# Patient Record
Sex: Female | Born: 1990 | Race: White | Hispanic: No | Marital: Married | Smoking: Never smoker
Health system: Southern US, Community
[De-identification: ages and names within clinical notes are randomized; demographics above are authoritative.]

## PROBLEM LIST (undated history)

## (undated) DIAGNOSIS — F32A Depression, unspecified: Secondary | ICD-10-CM

## (undated) DIAGNOSIS — F419 Anxiety disorder, unspecified: Secondary | ICD-10-CM

## (undated) DIAGNOSIS — Z789 Other specified health status: Secondary | ICD-10-CM

## (undated) DIAGNOSIS — F909 Attention-deficit hyperactivity disorder, unspecified type: Secondary | ICD-10-CM

## (undated) DIAGNOSIS — F329 Major depressive disorder, single episode, unspecified: Secondary | ICD-10-CM

## (undated) HISTORY — DX: Attention-deficit hyperactivity disorder, unspecified type: F90.9

## (undated) HISTORY — DX: Other specified health status: Z78.9

## (undated) HISTORY — PX: NO PAST SURGERIES: SHX2092

---
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
## 2010-04-25 ENCOUNTER — Emergency Department: Payer: Self-pay | Admitting: Emergency Medicine

## 2014-02-07 DIAGNOSIS — M546 Pain in thoracic spine: Secondary | ICD-10-CM | POA: Insufficient documentation

## 2014-04-25 NOTE — L&D Delivery Note (Signed)
Delivery Note At 6:32 PM a viable female was delivered via Vaginal, Spontaneous Delivery (Presentation: OA ;  ).  APGAR:9/9   , ; weight  .   Placenta status:intact>>spont , .  Cord:  with the following complications: .  Cord pH: not sent  Anesthesia: Epidural  Episiotomy:  nono Lacerations:  Sec deg Suture Repair: 3.0 vicryl rapide Est. Blood Loss (mL):  300 Mom to postpartum.  Baby to Couplet care / Skin to Skin.  [name],Yasmeen Manka M 03/02/2015, 6:47 PM

## 2014-07-22 LAB — OB RESULTS CONSOLE RUBELLA ANTIBODY, IGM: RUBELLA: NON-IMMUNE/NOT IMMUNE

## 2014-07-22 LAB — OB RESULTS CONSOLE ABO/RH: RH Type: POSITIVE

## 2014-07-22 LAB — OB RESULTS CONSOLE RPR: RPR: NONREACTIVE

## 2014-07-22 LAB — OB RESULTS CONSOLE GC/CHLAMYDIA
CHLAMYDIA, DNA PROBE: NEGATIVE
Gonorrhea: NEGATIVE

## 2014-07-22 LAB — OB RESULTS CONSOLE HIV ANTIBODY (ROUTINE TESTING): HIV: NONREACTIVE

## 2014-07-22 LAB — OB RESULTS CONSOLE HEPATITIS B SURFACE ANTIGEN: HEP B S AG: NEGATIVE

## 2014-07-22 LAB — OB RESULTS CONSOLE ANTIBODY SCREEN: Antibody Screen: NEGATIVE

## 2014-09-21 ENCOUNTER — Encounter (HOSPITAL_COMMUNITY): Payer: Self-pay | Admitting: Emergency Medicine

## 2014-09-21 ENCOUNTER — Emergency Department (HOSPITAL_COMMUNITY)
Admission: EM | Admit: 2014-09-21 | Discharge: 2014-09-21 | Disposition: A | Discharge status: Home / Self Care | Payer: BLUE CROSS/BLUE SHIELD | Attending: Emergency Medicine | Admitting: Emergency Medicine

## 2014-09-21 DIAGNOSIS — R000 Tachycardia, unspecified: Secondary | ICD-10-CM | POA: Insufficient documentation

## 2014-09-21 DIAGNOSIS — O9989 Other specified diseases and conditions complicating pregnancy, childbirth and the puerperium: Secondary | ICD-10-CM | POA: Insufficient documentation

## 2014-09-21 DIAGNOSIS — B349 Viral infection, unspecified: Secondary | ICD-10-CM | POA: Insufficient documentation

## 2014-09-21 DIAGNOSIS — Z3A17 17 weeks gestation of pregnancy: Secondary | ICD-10-CM | POA: Insufficient documentation

## 2014-09-21 DIAGNOSIS — O98812 Other maternal infectious and parasitic diseases complicating pregnancy, second trimester: Secondary | ICD-10-CM | POA: Diagnosis present

## 2014-09-21 DIAGNOSIS — R05 Cough: Secondary | ICD-10-CM

## 2014-09-21 DIAGNOSIS — R059 Cough, unspecified: Secondary | ICD-10-CM

## 2014-09-21 NOTE — Discharge Instructions (Signed)
Return to the emergency room with worsening of symptoms, new symptoms or with symptoms that are concerning, especially chest pain, shortness of breath, difficulty breathing, coughing up blood, leg swelling, abdominal pain, vaginal discharge or bleeding., Fevers or chills. Discussed continued use of Sudafed with your OB/GYN before taking additional doses. You may take Benadryl at night to help with sleep for the next 3-4 days. Please call your doctor for a followup appointment within 24-48 hours. When you talk to your doctor please let them know that you were seen in the emergency department and have them acquire all of your records so that they can discuss the findings with you and formulate a treatment plan to fully care for your new and ongoing problems. Read below information and follow recommendations. Upper Respiratory Infection, Adult An upper respiratory infection (URI) is also sometimes known as the common cold. The upper respiratory tract includes the nose, sinuses, throat, trachea, and bronchi. Bronchi are the airways leading to the lungs. Most people improve within 1 week, but symptoms can last up to 2 weeks. A residual cough may last even longer.  CAUSES Many different viruses can infect the tissues lining the upper respiratory tract. The tissues become irritated and inflamed and often become very moist. Mucus production is also common. A cold is contagious. You can easily spread the virus to others by oral contact. This includes kissing, sharing a glass, coughing, or sneezing. Touching your mouth or nose and then touching a surface, which is then touched by another person, can also spread the virus. SYMPTOMS  Symptoms typically develop 1 to 3 days after you come in contact with a cold virus. Symptoms vary from person to person. They may include:  Runny nose.  Sneezing.  Nasal congestion.  Sinus irritation.  Sore throat.  Loss of voice (laryngitis).  Cough.  Fatigue.  Muscle  aches.  Loss of appetite.  Headache.  Low-grade fever. DIAGNOSIS  You might diagnose your own cold based on familiar symptoms, since most people get a cold 2 to 3 times a year. Your caregiver can confirm this based on your exam. Most importantly, your caregiver can check that your symptoms are not due to another disease such as strep throat, sinusitis, pneumonia, asthma, or epiglottitis. Blood tests, throat tests, and X-rays are not necessary to diagnose a common cold, but they may sometimes be helpful in excluding other more serious diseases. Your caregiver will decide if any further tests are required. RISKS AND COMPLICATIONS  You may be at risk for a more severe case of the common cold if you smoke cigarettes, have chronic heart disease (such as heart failure) or lung disease (such as asthma), or if you have a weakened immune system. The very young and very old are also at risk for more serious infections. Bacterial sinusitis, middle ear infections, and bacterial pneumonia can complicate the common cold. The common cold can worsen asthma and chronic obstructive pulmonary disease (COPD). Sometimes, these complications can require emergency medical care and may be life-threatening. PREVENTION  The best way to protect against getting a cold is to practice good hygiene. Avoid oral or hand contact with people with cold symptoms. Wash your hands often if contact occurs. There is no clear evidence that vitamin C, vitamin E, echinacea, or exercise reduces the chance of developing a cold. However, it is always recommended to get plenty of rest and practice good nutrition. TREATMENT  Treatment is directed at relieving symptoms. There is no cure. Antibiotics are not effective,  because the infection is caused by a virus, not by bacteria. Treatment may include:  Increased fluid intake. Sports drinks offer valuable electrolytes, sugars, and fluids.  Breathing heated mist or steam (vaporizer or  shower).  Eating chicken soup or other clear broths, and maintaining good nutrition.  Getting plenty of rest.  Using gargles or lozenges for comfort.  Controlling fevers with ibuprofen or acetaminophen as directed by your caregiver.  Increasing usage of your inhaler if you have asthma. Zinc gel and zinc lozenges, taken in the first 24 hours of the common cold, can shorten the duration and lessen the severity of symptoms. Pain medicines may help with fever, muscle aches, and throat pain. A variety of non-prescription medicines are available to treat congestion and runny nose. Your caregiver can make recommendations and may suggest nasal or lung inhalers for other symptoms.  HOME CARE INSTRUCTIONS   Only take over-the-counter or prescription medicines for pain, discomfort, or fever as directed by your caregiver.  Use a warm mist humidifier or inhale steam from a shower to increase air moisture. This may keep secretions moist and make it easier to breathe.  Drink enough water and fluids to keep your urine clear or pale yellow.  Rest as needed.  Return to work when your temperature has returned to normal or as your caregiver advises. You may need to stay home longer to avoid infecting others. You can also use a face mask and careful hand washing to prevent spread of the virus. SEEK MEDICAL CARE IF:   After the first few days, you feel you are getting worse rather than better.  You need your caregiver's advice about medicines to control symptoms.  You develop chills, worsening shortness of breath, or brown or red sputum. These may be signs of pneumonia.  You develop yellow or brown nasal discharge or pain in the face, especially when you bend forward. These may be signs of sinusitis.  You develop a fever, swollen neck glands, pain with swallowing, or white areas in the back of your throat. These may be signs of strep throat. SEEK IMMEDIATE MEDICAL CARE IF:   You have a fever.  You  develop severe or persistent headache, ear pain, sinus pain, or chest pain.  You develop wheezing, a prolonged cough, cough up blood, or have a change in your usual mucus (if you have chronic lung disease).  You develop sore muscles or a stiff neck. Document Released: 10/05/2000 Document Revised: 07/04/2011 Document Reviewed: 07/17/2013 [facility] Patient Information 2015 [place], [place]. This information is not intended to replace advice given to you by your health care provider. Make sure you discuss any questions you have with your health care provider.

## 2014-09-21 NOTE — ED Notes (Signed)
Pt arrives from home stating she has "the flu." C/o cough, sinus congestion. Dry cough.

## 2014-09-21 NOTE — ED Provider Notes (Signed)
CSN: [id number]     Arrival date & time 09/21/14  0546 History   First MD Initiated Contact with Patient 09/21/14 [id number]     Chief Complaint  Patient presents with  . Cough     (Consider location/radiation/quality/duration/timing/severity/associated sxs/prior Treatment) HPI  Maria Yoder is a 24 y.o. female who is [redacted] weeks pregnant with prenatal care presenting with 5 days of dry cough, sinus congestion. Symptoms worse at night and not improved with sudafed, robatussin recommended by pt's OBGYN. No chest pain or shortness of breath. Pt denies fevers chills, hemoptysis. Pt denies history of DVT, PE, recent surgery or trauma, malignancy, hemoptysis, unilateral leg swelling or tenderness, immobilization. Patient denies abdominal pain, vaginal bleeding, vaginal discharge. No urinary symptoms.    History reviewed. No pertinent past medical history. History reviewed. No pertinent past surgical history. No family history on file. History  Substance Use Topics  . Smoking status: Never Smoker   . Smokeless tobacco: Not on file  . Alcohol Use: No   OB History    Gravida Para Term Preterm AB TAB SAB Ectopic Multiple Living   1              Review of Systems 10 Systems reviewed and are negative for acute change except as noted in the HPI.    Allergies  Review of patient's allergies indicates no known allergies.  Home Medications   Prior to Admission medications   Medication Sig Start Date End Date Taking? Authorizing Provider  Doxylamine-Pyridoxine 10-10 MG TBEC Take 1 tablet by mouth 4 (four) times daily as needed (nauses).   Yes Historical Provider, MD  ondansetron (ZOFRAN) 4 MG tablet Take 4 mg by mouth every 8 (eight) hours as needed for nausea or vomiting.   Yes Historical Provider, MD  Prenatal Vit-Fe Fumarate-FA (PRENATAL MULTIVITAMIN) TABS tablet Take 1 tablet by mouth daily at 12 noon.   Yes Historical Provider, MD   BP 126/80 mmHg  Pulse 115  Temp(Src) 98 F (36.7 C)  (Oral)  Resp 22  Ht 5\' 10"  (1.778 m)  Wt 165 lb (74.844 kg)  BMI 23.68 kg/m2  SpO2 100%  LMP 04/25/2014 (Within Weeks) Physical Exam  Constitutional: She appears well-developed and well-nourished. No distress.  HENT:  Head: Normocephalic and atraumatic.  Nose: Rhinorrhea present.  Mouth/Throat: Posterior oropharyngeal edema and posterior oropharyngeal erythema present. No oropharyngeal exudate.  Eyes: Conjunctivae and EOM are normal. Right eye exhibits no discharge. Left eye exhibits no discharge.  Cardiovascular: Normal rate and regular rhythm.   No leg swelling or tenderness. Negative Homan's sign.  Pulmonary/Chest: Effort normal and breath sounds normal.  Normal effort with no respiratory distress.  Abdominal: Soft. Bowel sounds are normal. She exhibits no distension. There is no tenderness.  Lymphadenopathy:    She has cervical adenopathy.  Neurological: She is alert. She exhibits normal muscle tone. Coordination normal.  Skin: Skin is warm and dry. She is not diaphoretic.  Nursing note and vitals reviewed.   ED Course  Procedures (including critical care time) Labs Review Labs Reviewed - No data to display  Imaging Review No results found.   EKG Interpretation None      MDM   Final diagnoses:  Cough  Viral syndrome   Pt [redacted] weeks pregnant presenting with cough. She denies chest pain or shortness of breath. No exertional symptoms. VSS other than mild tachycardia likely related to pregnancy. No respiratory distress. No hypoxia Pt rhinorrhea, oropharynx erythema, edema and cervical lymphadenopathy. Pt with viral syndrome.  Pt low risk for PE other than her pregnancy. Pt history and exam not consistent with PE at this time. Pt with 101 HR on monitor during my assessment. Pt given strict return precautions. Pt to discuss further sudafed use with her OBGYN. Pt to take benadryl for congestion.   Discussed return precautions with patient. Discussed all results and patient  verbalizes understanding and agrees with plan.  Case has been discussed with Dr. Ranae Palms who agrees with the above plan and to discharge.   Filed Vitals:   09/21/14 0553 09/21/14 0607 09/21/14 0630  BP: 123/69 156/70 126/80  Pulse: 103 78 115  Temp: 97.5 F (36.4 C) 98 F (36.7 C)   TempSrc: Oral Oral   Resp: 16 22   Height:  (1.549 m)  (1.778 m)   Weight: 130 lb (58.968 kg) 165 lb (74.844 kg)   SpO2: 100% 99% 100%     Oswaldo Conroy, PA-C 09/21/14 0815  Loren Racer, MD 09/22/14 [phone]

## 2015-02-25 ENCOUNTER — Encounter (HOSPITAL_COMMUNITY): Payer: Self-pay | Admitting: *Deleted

## 2015-02-25 ENCOUNTER — Telehealth (HOSPITAL_COMMUNITY): Payer: Self-pay | Admitting: *Deleted

## 2015-02-25 LAB — OB RESULTS CONSOLE GBS: GBS: NEGATIVE

## 2015-02-25 NOTE — Telephone Encounter (Signed)
Preadmission screen  

## 2015-03-01 ENCOUNTER — Encounter (HOSPITAL_COMMUNITY): Payer: Self-pay | Admitting: *Deleted

## 2015-03-01 ENCOUNTER — Inpatient Hospital Stay (HOSPITAL_COMMUNITY)
Admission: AD | Admit: 2015-03-01 | Discharge: 2015-03-04 | DRG: 775 | Disposition: A | Discharge status: Home / Self Care | Payer: BLUE CROSS/BLUE SHIELD | Source: Ambulatory Visit | Attending: Obstetrics and Gynecology | Admitting: Obstetrics and Gynecology

## 2015-03-01 DIAGNOSIS — O26893 Other specified pregnancy related conditions, third trimester: Secondary | ICD-10-CM | POA: Diagnosis present

## 2015-03-01 DIAGNOSIS — Z3490 Encounter for supervision of normal pregnancy, unspecified, unspecified trimester: Secondary | ICD-10-CM

## 2015-03-01 DIAGNOSIS — Z3A40 40 weeks gestation of pregnancy: Secondary | ICD-10-CM | POA: Diagnosis not present

## 2015-03-01 LAB — CBC
HCT: 37.5 % (ref 36.0–46.0)
HEMOGLOBIN: 13 g/dL (ref 12.0–15.0)
MCH: 31.1 pg (ref 26.0–34.0)
MCHC: 34.7 g/dL (ref 30.0–36.0)
MCV: 89.7 fL (ref 78.0–100.0)
PLATELETS: 210 10*3/uL (ref 150–400)
RBC: 4.18 MIL/uL (ref 3.87–5.11)
RDW: 14 % (ref 11.5–15.5)
WBC: 10.2 10*3/uL (ref 4.0–10.5)

## 2015-03-01 LAB — TYPE AND SCREEN
ABO/RH(D): O POS
Antibody Screen: NEGATIVE

## 2015-03-01 MED ORDER — CITRIC ACID-SODIUM CITRATE 334-500 MG/5ML PO SOLN: 30.0000 mL | ORAL | Status: DC | PRN

## 2015-03-01 MED ORDER — ACETAMINOPHEN 325 MG PO TABS: 650.0000 mg | ORAL_TABLET | ORAL | Status: DC | PRN

## 2015-03-01 MED ORDER — ONDANSETRON HCL 4 MG/2ML IJ SOLN
4.0000 mg | Freq: Four times a day (QID) | INTRAMUSCULAR | Status: DC | PRN
  Administered 2015-03-02: 4 mg via INTRAVENOUS
  Filled 2015-03-01: qty 2

## 2015-03-01 MED ORDER — LIDOCAINE HCL (PF) 1 % IJ SOLN
30.0000 mL | INTRAMUSCULAR | Status: DC | PRN
  Administered 2015-03-02: 30 mL via SUBCUTANEOUS
  Filled 2015-03-01: qty 30

## 2015-03-01 MED ORDER — MISOPROSTOL 25 MCG QUARTER TABLET
25.0000 ug | ORAL_TABLET | ORAL | Status: DC | PRN
  Administered 2015-03-01 – 2015-03-02 (×2): 25 ug via VAGINAL
  Filled 2015-03-01 (×2): qty 0.25

## 2015-03-01 MED ORDER — ZOLPIDEM TARTRATE 5 MG PO TABS: 5.0000 mg | ORAL_TABLET | Freq: Every day | ORAL | Status: DC

## 2015-03-01 MED ORDER — TERBUTALINE SULFATE 1 MG/ML IJ SOLN: 0.2500 mg | Freq: Once | INTRAMUSCULAR | Status: DC | PRN

## 2015-03-01 MED ORDER — FLEET ENEMA 7-19 GM/118ML RE ENEM: 1.0000 | ENEMA | RECTAL | Status: DC | PRN

## 2015-03-01 MED ORDER — OXYCODONE-ACETAMINOPHEN 5-325 MG PO TABS: 2.0000 | ORAL_TABLET | ORAL | Status: DC | PRN

## 2015-03-01 MED ORDER — OXYCODONE-ACETAMINOPHEN 5-325 MG PO TABS: 1.0000 | ORAL_TABLET | ORAL | Status: DC | PRN

## 2015-03-02 ENCOUNTER — Inpatient Hospital Stay (HOSPITAL_COMMUNITY): Payer: BLUE CROSS/BLUE SHIELD | Admitting: Anesthesiology

## 2015-03-02 ENCOUNTER — Inpatient Hospital Stay (HOSPITAL_COMMUNITY): Admission: RE | Admit: 2015-03-02 | Payer: BLUE CROSS/BLUE SHIELD | Source: Ambulatory Visit

## 2015-03-02 ENCOUNTER — Encounter (HOSPITAL_COMMUNITY): Payer: Self-pay | Admitting: *Deleted

## 2015-03-02 LAB — ABO/RH: ABO/RH(D): O POS

## 2015-03-02 LAB — RPR: RPR: NONREACTIVE

## 2015-03-02 MED ORDER — PRENATAL MULTIVITAMIN CH
1.0000 | ORAL_TABLET | Freq: Every day | ORAL | Status: DC
  Administered 2015-03-03: 1 via ORAL
  Filled 2015-03-02 (×2): qty 1

## 2015-03-02 MED ORDER — TETANUS-DIPHTH-ACELL PERTUSSIS 5-2.5-18.5 LF-MCG/0.5 IM SUSP: 0.5000 mL | Freq: Once | INTRAMUSCULAR | Status: DC

## 2015-03-02 MED ORDER — WITCH HAZEL-GLYCERIN EX PADS
1.0000 "application " | MEDICATED_PAD | CUTANEOUS | Status: DC | PRN
  Administered 2015-03-03: 1 via TOPICAL

## 2015-03-02 MED ORDER — OXYCODONE-ACETAMINOPHEN 5-325 MG PO TABS
1.0000 | ORAL_TABLET | ORAL | Status: DC | PRN
  Administered 2015-03-02 – 2015-03-03 (×4): 1 via ORAL
  Filled 2015-03-02 (×4): qty 1

## 2015-03-02 MED ORDER — PROMETHAZINE HCL 25 MG/ML IJ SOLN
12.5000 mg | Freq: Once | INTRAMUSCULAR | Status: AC
  Administered 2015-03-02: 12.5 mg via INTRAVENOUS
  Filled 2015-03-02: qty 1

## 2015-03-02 MED ORDER — LIDOCAINE HCL (PF) 1 % IJ SOLN
INTRAMUSCULAR | Status: DC | PRN
  Administered 2015-03-02 (×2): 4 mL

## 2015-03-02 MED ORDER — ONDANSETRON HCL 4 MG/2ML IJ SOLN: 4.0000 mg | INTRAMUSCULAR | Status: DC | PRN

## 2015-03-02 MED ORDER — OXYTOCIN 40 UNITS IN LACTATED RINGERS INFUSION - SIMPLE MED
INTRAVENOUS | Status: AC
  Administered 2015-03-02: 40 [IU]
  Filled 2015-03-02: qty 1000

## 2015-03-02 MED ORDER — DIPHENHYDRAMINE HCL 25 MG PO CAPS: 25.0000 mg | ORAL_CAPSULE | Freq: Four times a day (QID) | ORAL | Status: DC | PRN

## 2015-03-02 MED ORDER — LACTATED RINGERS IV SOLN
INTRAVENOUS | Status: DC
  Administered 2015-03-02 (×2): via INTRAVENOUS

## 2015-03-02 MED ORDER — FENTANYL 2.5 MCG/ML BUPIVACAINE 1/10 % EPIDURAL INFUSION (WH - ANES)
14.0000 mL/h | INTRAMUSCULAR | Status: DC | PRN
  Administered 2015-03-02 (×2): 14 mL/h via EPIDURAL
  Filled 2015-03-02 (×2): qty 125

## 2015-03-02 MED ORDER — MEASLES, MUMPS & RUBELLA VAC ~~LOC~~ INJ: 0.5000 mL | INJECTION | Freq: Once | SUBCUTANEOUS | Status: DC

## 2015-03-02 MED ORDER — ACETAMINOPHEN 325 MG PO TABS: 650.0000 mg | ORAL_TABLET | ORAL | Status: DC | PRN

## 2015-03-02 MED ORDER — BUTORPHANOL TARTRATE 1 MG/ML IJ SOLN
2.0000 mg | Freq: Once | INTRAMUSCULAR | Status: AC
  Administered 2015-03-02: 2 mg via INTRAVENOUS
  Filled 2015-03-02: qty 2

## 2015-03-02 MED ORDER — ONDANSETRON HCL 4 MG PO TABS: 4.0000 mg | ORAL_TABLET | ORAL | Status: DC | PRN

## 2015-03-02 MED ORDER — EPHEDRINE 5 MG/ML INJ: 10.0000 mg | INTRAVENOUS | Status: DC | PRN

## 2015-03-02 MED ORDER — IBUPROFEN 800 MG PO TABS
800.0000 mg | ORAL_TABLET | Freq: Three times a day (TID) | ORAL | Status: DC | PRN
  Administered 2015-03-02 – 2015-03-04 (×5): 800 mg via ORAL
  Filled 2015-03-02 (×5): qty 1

## 2015-03-02 MED ORDER — ZOLPIDEM TARTRATE 5 MG PO TABS: 5.0000 mg | ORAL_TABLET | Freq: Every evening | ORAL | Status: DC | PRN

## 2015-03-02 MED ORDER — BISACODYL 10 MG RE SUPP
10.0000 mg | Freq: Every day | RECTAL | Status: DC | PRN
  Filled 2015-03-02: qty 1

## 2015-03-02 MED ORDER — BENZOCAINE-MENTHOL 20-0.5 % EX AERO
1.0000 "application " | INHALATION_SPRAY | CUTANEOUS | Status: DC | PRN
  Administered 2015-03-02 – 2015-03-04 (×2): 1 via TOPICAL
  Filled 2015-03-02 (×2): qty 56

## 2015-03-02 MED ORDER — LACTATED RINGERS IV BOLUS (SEPSIS)
500.0000 mL | INTRAVENOUS | Status: DC | PRN
  Administered 2015-03-02 (×2): 500 mL via INTRAVENOUS
  Filled 2015-03-02 (×2): qty 500

## 2015-03-02 MED ORDER — FLEET ENEMA 7-19 GM/118ML RE ENEM: 1.0000 | ENEMA | Freq: Every day | RECTAL | Status: DC | PRN

## 2015-03-02 MED ORDER — DIBUCAINE 1 % RE OINT: 1.0000 "application " | TOPICAL_OINTMENT | RECTAL | Status: DC | PRN

## 2015-03-02 MED ORDER — DIPHENHYDRAMINE HCL 50 MG/ML IJ SOLN: 12.5000 mg | INTRAMUSCULAR | Status: DC | PRN

## 2015-03-02 MED ORDER — PHENYLEPHRINE 40 MCG/ML (10ML) SYRINGE FOR IV PUSH (FOR BLOOD PRESSURE SUPPORT)
80.0000 ug | PREFILLED_SYRINGE | INTRAVENOUS | Status: DC | PRN
  Filled 2015-03-02: qty 20

## 2015-03-02 MED ORDER — SIMETHICONE 80 MG PO CHEW
80.0000 mg | CHEWABLE_TABLET | ORAL | Status: DC | PRN
Start: 2015-03-02 — End: 2015-03-04

## 2015-03-02 MED ORDER — OXYCODONE-ACETAMINOPHEN 5-325 MG PO TABS
2.0000 | ORAL_TABLET | ORAL | Status: DC | PRN
  Administered 2015-03-03 – 2015-03-04 (×5): 2 via ORAL
  Filled 2015-03-02 (×6): qty 2

## 2015-03-02 MED ORDER — LANOLIN HYDROUS EX OINT: TOPICAL_OINTMENT | CUTANEOUS | Status: DC | PRN

## 2015-03-02 MED ORDER — SENNOSIDES-DOCUSATE SODIUM 8.6-50 MG PO TABS
2.0000 | ORAL_TABLET | ORAL | Status: DC
  Administered 2015-03-02 – 2015-03-04 (×2): 2 via ORAL
  Filled 2015-03-02 (×2): qty 2

## 2015-03-02 NOTE — Anesthesia Procedure Notes (Signed)
Epidural Patient location during procedure: OB Start time: 03/02/2015 8:06 AM End time: 03/02/2015 8:13 AM  Staffing Anesthesiologist: Shona SimpsonHOLLIS, Inis Borneman D Performed by: anesthesiologist   Preanesthetic Checklist Completed: patient identified, site marked, surgical consent, pre-op evaluation, timeout performed, IV checked, risks and benefits discussed and monitors and equipment checked  Epidural Patient position: sitting Prep: DuraPrep Patient monitoring: heart rate, continuous pulse ox and blood pressure Approach: midline Location: L4-L5 Injection technique: LOR saline  Needle:  Needle type: Tuohy  Needle gauge: 18 G Needle length: 9 cm and 9 Catheter type: closed end flexible Catheter size: 20 Guage Test dose: negative and Other  Assessment Events: blood not aspirated, injection not painful, no injection resistance, negative IV test and no paresthesia  Additional Notes LOR @ 6  Patient identified. Risks/Benefits/Options discussed with patient including but not limited to bleeding, infection, nerve damage, paralysis, failed block, incomplete pain control, headache, blood pressure changes, nausea, vomiting, reactions to medications, itching and postpartum back pain. Confirmed with bedside nurse the patient's most recent platelet count. Confirmed with patient that they are not currently taking any anticoagulation, have any bleeding history or any family history of bleeding disorders. Patient expressed understanding and wished to proceed. All questions were answered. Sterile technique was used throughout the entire procedure. Please see nursing notes for vital signs. Test dose was given through epidural catheter and negative prior to continuing to dose epidural or start infusion. Warning signs of high block given to the patient including shortness of breath, tingling/numbness in hands, complete motor block, or any concerning symptoms with instructions to call for help. Patient was given  instructions on fall risk and not to get out of bed. All questions and concerns addressed with instructions to call with any issues or inadequate analgesia.      Patient tolerated the insertion well without complications.Reason for block:procedure for pain

## 2015-03-02 NOTE — Anesthesia Preprocedure Evaluation (Addendum)
Anesthesia Evaluation  Patient identified by MRN, date of birth, ID band Patient awake    Reviewed: Allergy & Precautions, NPO status , Patient's Chart, lab work & pertinent test results  Airway Mallampati: I  TM Distance: >3 FB Neck ROM: Full    Dental  (+) Teeth Intact   Pulmonary neg pulmonary ROS,    breath sounds clear to auscultation       Cardiovascular negative cardio ROS   Rhythm:Regular Rate:Normal     Neuro/Psych negative neurological ROS  negative psych ROS   GI/Hepatic negative GI ROS, Neg liver ROS,   Endo/Other  negative endocrine ROS  Renal/GU negative Renal ROS  negative genitourinary   Musculoskeletal negative musculoskeletal ROS (+)   Abdominal   Peds negative pediatric ROS (+)  Hematology negative hematology ROS (+)   Anesthesia Other Findings   Reproductive/Obstetrics (+) Pregnancy                            Lab Results  Component Value Date   WBC 10.2 03/01/2015   HGB 13.0 03/01/2015   HCT 37.5 03/01/2015   MCV 89.7 03/01/2015   PLT 210 03/01/2015   No results found for: INR, PROTIME   Anesthesia Physical Anesthesia Plan  ASA: II  Anesthesia Plan: Epidural   Post-op Pain Management:    Induction:   Airway Management Planned:   Additional Equipment:   Intra-op Plan:   Post-operative Plan:   Informed Consent: I have reviewed the patients History and Physical, chart, labs and discussed the procedure including the risks, benefits and alternatives for the proposed anesthesia with the patient or authorized representative who has indicated his/her understanding and acceptance.     Plan Discussed with:   Anesthesia Plan Comments:         Anesthesia Quick Evaluation

## 2015-03-02 NOTE — H&P (Signed)
Maria Yoder  DICTATION # [id number] CSN# [id number]   [name],[name], MD 03/02/2015 9:04 AM

## 2015-03-02 NOTE — H&P (Signed)
NAMOrland Yoder:  WALKER, Dara               ACCOUNT NO.:  [id number]  MEDICAL RECORD NO.:  [id number]  LOCATION:  9173                          FACILITY:  [facility]  PHYSICIAN:  Duke Salviaichard M. Marcelle OverlieHolland, M.D.DATE OF BIRTH:  30-Jul-1990  DATE OF ADMISSION:  03/01/2015 DATE OF DISCHARGE:                             HISTORY & PHYSICAL   CHIEF COMPLAINT:  For labor induction at term.  HISTORY OF PRESENT ILLNESS:  A 24 year old G1, P0, EDD February 26, 2015, presents for labor induction at term.  Most recent ultrasound October, 31, 2016.  EFW 75th percentile, 8 pounds 3 ounces.  She was felt to be 1 posterior 50% with a reactive NST on last week's visit also.  Prenatal course has been uneventful with GBS negative, 1-hour GTT normal at 97th. The protocol for 2 stage labor induction with Cytotec explained to her. Blood type is O positive.  PAST MEDICAL HISTORY:  Please see the [facility] form for details.  PHYSICAL EXAMINATION:  VITAL SIGNS:  Temp 98.2, blood pressure 100/78. HEENT unremarkable. NECK:  Supple without masses. LUNGS:  Clear. CARDIOVASCULAR:  Regular rate and rhythm without murmurs, rubs, or gallops noted. BREASTS:  Not examined. PELVIC:  Term fundal height.  Fetal heart rate was 140.  Cervix was 1, 50% posterior. EXTREMITIES:  Unremarkable. NEUROLOGIC:  Unremarkable.  IMPRESSION:  Term pregnancy, for two-stage labor induction, Cytotec/Pitocin/AROM induction.  Protocol reviewed with her.     Yuriko Portales M. Marcelle OverlieHolland, M.D.     RMH/MEDQ  D:  03/02/2015  T:  03/02/2015  Job:  [id number]

## 2015-03-02 NOTE — Progress Notes (Signed)
epid in, now 1/50/vtx/post, attempt at AROM by placing ISE

## 2015-03-02 NOTE — Progress Notes (Signed)
Now 3/75/vtx, AROM>>clear AF, FHR cat I

## 2015-03-03 LAB — CBC
HCT: 32.5 % — ABNORMAL LOW (ref 36.0–46.0)
Hemoglobin: 11.1 g/dL — ABNORMAL LOW (ref 12.0–15.0)
MCH: 31.1 pg (ref 26.0–34.0)
MCHC: 34.2 g/dL (ref 30.0–36.0)
MCV: 91 fL (ref 78.0–100.0)
PLATELETS: 178 10*3/uL (ref 150–400)
RBC: 3.57 MIL/uL — ABNORMAL LOW (ref 3.87–5.11)
RDW: 14.1 % (ref 11.5–15.5)
WBC: 14.8 10*3/uL — ABNORMAL HIGH (ref 4.0–10.5)

## 2015-03-03 MED ORDER — MEASLES, MUMPS & RUBELLA VAC ~~LOC~~ INJ: 0.5000 mL | INJECTION | Freq: Once | SUBCUTANEOUS | Status: DC

## 2015-03-03 NOTE — Anesthesia Postprocedure Evaluation (Signed)
Anesthesia Post Note  Patient: Maria Yoder  Procedure(s) Performed: * No procedures listed *  Anesthesia type: Epidural  Patient location: Mother/Baby  Post pain: Pain level controlled  Post assessment: Post-op Vital signs reviewed  Last Vitals:  Filed Vitals:   03/03/15 0930  BP: 120/70  Pulse: 55  Temp: 36.9 C  Resp: 16    Post vital signs: Reviewed  Level of consciousness:alert  Complications: No apparent anesthesia complications

## 2015-03-03 NOTE — Lactation Note (Signed)
This note was copied from the chart of Maria Verneita Griffesallie Yoder. Lactation Consultation Note  Patient Name: Maria Yoder YNWGN'FToday's Date: 03/03/2015 Reason for consult: Initial assessment  Baby 19 hours old. Parents state that baby nursed about 30 minutes earlier and is now sleeping in mom's arms. Mom states that baby usually nurses for about 20 minutes, then pulls off breasts and sleeps. Enc mom to nurse with cues and call for assistance with latching as needed. Mom reports that she is seeing colostrum from both breasts. [facility] given [facility], aware of OP/BFSG, community resources, and [facility] after [facility].  Maternal Data Has patient been taught Hand Expression?: Yes (Per mom.) Does the patient have breastfeeding experience prior to this delivery?: No  Feeding Length of feed: 25 min  LATCH Score/Interventions                      Lactation Tools Discussed/Used     Consult Status Consult Status: Follow-up Date: 03/04/15 Follow-up type: In-patient    Geralynn OchsWILLIARD, Bharath Bernstein 03/03/2015, 1:47 PM

## 2015-03-03 NOTE — Progress Notes (Signed)
Post Partum Day 1 Subjective: no complaints, up ad lib, voiding, tolerating PO and + flatus  Objective: Blood pressure 115/56, pulse 45, temperature 98 F (36.7 C), temperature source Oral, resp. rate 18, height 5\' 1"  (1.549 m), weight 171 lb (77.565 kg), SpO2 100 %, unknown if currently breastfeeding.  Physical Exam:  General: alert and cooperative Lochia: appropriate Uterine Fundus: firm Incision: healing well DVT Evaluation: No evidence of DVT seen on physical exam. Negative Homan's sign. No cords or calf tenderness. Calf/Ankle edema is present.   Recent Labs  03/01/15 2031 03/03/15 0530  HGB 13.0 11.1*  HCT 37.5 32.5*    Assessment/Plan: Plan for discharge tomorrow   LOS: 2 days   CURTIS,CAROL G 03/03/2015, 7:56 AM

## 2015-03-04 NOTE — Progress Notes (Signed)
CLINICAL SOCIAL WORK MATERNAL/CHILD NOTE  Patient Details  Name: Maria Yoder MRN: [id number] Date of Birth: 03/02/2015  Date:  03/04/2015  Clinical Social Worker Initiating Note:  Elissa Hefty, MSW intern  Date/ Time Initiated:  03/04/15/0845     Child's Name:  Maria Yoder    Legal Guardian:  Maria Yoder and Maria Yoder    Need for Interpreter:  None   Date of Referral:  03/03/15     Reason for Referral:  Behavioral Health Issues, including SI    Referral Source:  [facility]   Address:  [address], [place] [address]   Phone number:  [phone]   Household Members:  Self, and FOB    Natural Supports (not living in the home):  Immediate Family, Parent, Spouse/significant other   Professional Supports: None   Employment: Unemployed- [name] stated she was working but quit her job during the pregnancy because she was sick a lot during the pregnancy.    Type of Work:     Education:  Database administrator Resources:  Multimedia programmer   Other Resources:      Cultural/Religious Considerations Which May Impact Care:  None Reported   Strengths:  Ability to meet basic needs , Home prepared for child    Risk Factors/Current Problems:  Mental Health Concerns - [name] presents with a history of anxiety. [name] denied any concerns postpartum. She stated the majority of her anxiety was due to her fears of labor and how it would feel pain wise. [name] reported her insomnia was because of her inability to get comfortable while being pregnant and her fear of the labor process.  [name] denied being on any medications or therapy for her anxiety.   Cognitive State:  Alert , Goal Oriented , Insightful , Linear Thinking    Mood/Affect:  Happy , Interested , Bright , Comfortable    CSW Assessment:  MSW intern presented in the patient's room due to a consult being placed because of anxiety and insomnia during the pregnancy. FOB was present in  the room and [name] provided verbal consent for MSW intern to engage. [name] presented to be in a happy mood as evidence by her breastfeeding her infant, eating breakfast, and answering questions during the assessment. [name] did express being tired because the infant had been cluster feeding all night and they had not been able to rest well. FOB asked how long to expect the cluster feeding to continue. MSW intern informed them that it varies and it can lasts a few days but to ask the lactation for further information. [name] voiced being sore around her breast area and having a lot of questions for the lactation in regard to pain and feeding properly. [name] denied having issues with her breast milk coming in and was overall content with the breastfeeding process but just wanted information on how to take care of her soreness and pumping.  MSW intern asked [name] about her birthing experience. [name] expressed it wasn't what she expected it to be and she still did not know how she felt about it. [name] shared she received an epidural but still felt pressure during the labor. [name] shared she only pushed for 30 minutes so it wasn't too bad in regard to having to deal with the pain but did feel disappointed about  The birthing process not being what she expected. [name] stated she did not expect to feel pressure and had envisioned labor to be less  painful. [name] was still processing her feeling in regard to the birthing process but was able to say it was "all worth it," because the infant was healthy and it did not last long.   [name] shared she has met all of the infant's basic needs and is prepared to take the infant home. Per [name], she has a good support system from FOB and their families. According to [name], she was working during the pregnancy but had to quit her job due to the fact that she was sick the majority of her pregnancy. [name] shared she does not plan to seek employment anytime soon and will be staying at home to care for the infant and  get adjusted to her new schedule.   MSW intern inquired about [name]'s anxiety during the pregnancy. [name] shared the majority of her anxiety was due to the fears of labor and what she expected to be. [name] stated she was fearful of the pain she would experienced and amount of time it would take. [name] shared her insomnia was due to the anxiety and inability to get comfortable because of her pregnancy.  [name] denied her anxiety interfering with her daily activities or being out of her control. MSW intern inquired if she had experienced anxiety prior to the pregnancy. [name] shared she had felt anxious before but could not recall what triggered those feelings of anxiety. MSW intern asked questions in relation to triggering factors of anxiety, for example; employment, education, phase of life, or family dynamics. [name] denied any of those things being of concern to her or bringing her anxiety. [name] was unable to identify her past anxiety as a concerns and its triggers. [name] denied being anxious postpartum. MSW intern provided education on perinatal mood disorders and the hospital's support group, "Feelings After Birth." MSW intern also left two handouts for [name] to refer to that explained perinatal mood symptoms along with a postpartum checklist. [name] denied having any further questions or concerns but agreed to contact MSW intern if needs arise. [name] also agreed to contact her OB if needs arise in regard to her feelings of anxiety during the pregnancy and if they become a concern postpartum.   [name] denied having any further questions or concerns. [name] presented to be tired and was slowly starting to disengage from the assessment at this point. MSW intern congratulated [name] on her new addition to her family and reminded [name] she could contact her if needs arise. [name] was appreciative of the information provided.   CSW Plan/Description:   Engineer, mining- MSW intern provided education on perinatal mood disorders.  No Further  Intervention Required/No Barriers to Discharge    Trevor Iha, Student-SW 03/04/2015, 9:31 AM

## 2015-03-04 NOTE — Lactation Note (Addendum)
This note was copied from the chart of Maria Yoder. Lactation Consultation Note:  Staff nurse request pre and post weight on infant per Dr Ezequiel EssexGable. Mother has been cue base feeding. Infant is 7041 hours old . She has had many feedings and attempts. Mother states that all feedings have been painful. She rates pain scale of #4-5.  Observed that mother has bilateral scabs.  Infants weight was 3485 g before feeding attempt. Infant latched on the left breast in cross cradle hold. Observed frequent chewing and non-nutritive suckling. No observed swallows. Mother taught breast compression. Infant was on and off for 15 mins. No gain noted after feeding, weight 3480 g. Assist mother with hand expression. Observed a few drops of colostrum from each breast.  Infant placed on alternate breast in football hold. Infant latched on with good good depth for 10 mins.  but no evidence of milk transfer. Weight remains 3480 g.  Infant continues to chew . 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 Infant tongue thrust a gloved finger and has difficulty forming a seal. Infants tongue extends well beyond aveolar ridge with good lateral mobility. Discussed mother's feeling on supplementing infant with small amt of formula. Mother states ok to supplement. Assist mother with hand expression. Infant was given .5 ml of colostrum with a spoon. Mother was also given a hand pump and advised to post pump for 15 mins on each breast after feeding. Mother states that she has a used electric pump at home that her sister-in-law gave her. Encouraged parents to allow for lots of assistance with breastfeeding.  Reviewed plan to breastfeed, supplement and post pump after each feeding. Discussed methods to supplement infant. Supplemental guidelines given to parents.  Staff nurse to assist with giving infant formula with a curved tip syringe.  [facility] to page for [facility] assistance with the next feeding.    Patient Name: Maria  Maria Yoder ZOXWR'UToday's Date: 03/04/2015 Reason for consult: Follow-up assessment   Maternal Data    Feeding Feeding Type: Breast Fed Length of feed: 10 min  LATCH Score/Interventions Latch: Grasps breast easily, tongue down, lips flanged, rhythmical sucking.  Audible Swallowing: None  Type of Nipple: Everted at rest and after stimulation  Comfort (Breast/Nipple): Filling, red/small blisters or bruises, mild/mod discomfort (bilateral scabs)  Problem noted: Cracked, bleeding, blisters, bruises;Mild/Moderate discomfort Interventions  (Cracked/bleeding/bruising/blister): Expressed breast milk to nipple  Hold (Positioning): Assistance needed to correctly position infant at breast and maintain latch. Intervention(s): Support Pillows;Position options;Skin to skin  LATCH Score: 6  Lactation Tools Discussed/Used     Consult Status Consult Status: Follow-up Date: 03/04/15 Follow-up type: In-patient    Maria Yoder, Maria Yoder [facility] 03/04/2015, 2:12 PM

## 2015-03-04 NOTE — Progress Notes (Signed)
Post Partum Day 2 Subjective: no complaints, up ad lib, voiding, tolerating PO and + flatus  Objective: Blood pressure 115/73, pulse 58, temperature 98.4 F (36.9 C), temperature source Oral, resp. rate 18, height 5\' 1"  (1.549 m), weight 171 lb (77.565 kg), SpO2 100 %, unknown if currently breastfeeding.  Physical Exam:  General: alert and cooperative Lochia: appropriate Uterine Fundus: firm Incision: healing well DVT Evaluation: No evidence of DVT seen on physical exam. Negative Homan's sign. No cords or calf tenderness. No significant calf/ankle edema.   Recent Labs  03/01/15 2031 03/03/15 0530  HGB 13.0 11.1*  HCT 37.5 32.5*    Assessment/Plan: Discharge home  Discharge orders can not be completed , needs admission orders entered   LOS: 3 days   CURTIS,CAROL G 03/04/2015, 7:50 AM

## 2015-03-04 NOTE — Discharge Summary (Signed)
Obstetric Discharge Summary Reason for Admission: induction of labor Prenatal Procedures: ultrasound Intrapartum Procedures: spontaneous vaginal delivery Postpartum Procedures: none Complications-Operative and Postpartum: 2 degree perineal laceration HEMOGLOBIN  Date Value Ref Range Status  03/03/2015 11.1* 12.0 - 15.0 g/dL Final   HCT  Date Value Ref Range Status  03/03/2015 32.5* 36.0 - 46.0 % Final    Physical Exam:  General: alert and cooperative Lochia: appropriate Uterine Fundus: firm Incision: healing well DVT Evaluation: No evidence of DVT seen on physical exam. Negative Homan's sign. No cords or calf tenderness. No significant calf/ankle edema.  Discharge Diagnoses: Term Pregnancy-delivered  Discharge Information: Date: 03/04/2015 Activity: pelvic rest Diet: routine Medications: PNV, Ibuprofen and Percocet Condition: stable Instructions: refer to practice specific booklet Discharge to: home   Newborn Data: Live born female  Birth Weight: 8 lb 7.1 oz (3830 g) APGAR: 9, 9  Home with mother.  CURTIS,CAROL G 03/04/2015, 7:44 AM

## 2015-08-18 ENCOUNTER — Telehealth (HOSPITAL_COMMUNITY): Payer: Self-pay | Admitting: Lactation Services

## 2015-08-18 NOTE — Telephone Encounter (Signed)
Mom of 366 month old called with questions about night nursing.  Baby is recently waking frequently at night and will only be comforted by breast.  Mom has allowed her to cry some but baby is not self soothing.  Discussed other ways to calm and soothe baby before offering breast.  Mom states she is tired and often brings baby to bed.  Encouraged to reassure baby and comfort but let baby remain in her own bed.  I told mother to go with her gut feeling of what is best for baby when dealing with this situation.  Mom appreciative of phone call.

## 2018-01-24 ENCOUNTER — Telehealth: Payer: Self-pay | Admitting: Psychiatry

## 2018-01-24 NOTE — Telephone Encounter (Signed)
Pt called for refill on vyvanse 30 mg. Send to home address. Insurance paid for meds

## 2018-01-25 NOTE — Telephone Encounter (Signed)
rx written for provider to sign. Will mail to pt as requested.

## 2018-03-15 ENCOUNTER — Other Ambulatory Visit: Payer: Self-pay | Admitting: Psychiatry

## 2018-03-15 DIAGNOSIS — F900 Attention-deficit hyperactivity disorder, predominantly inattentive type: Secondary | ICD-10-CM

## 2018-03-15 MED ORDER — LISDEXAMFETAMINE DIMESYLATE 30 MG PO CAPS: 30.0000 mg | ORAL_CAPSULE | Freq: Every day | ORAL | 0 refills | Status: DC

## 2018-03-15 NOTE — Progress Notes (Signed)
Patient wanted to go back to Vyvanse as discussed before.  Sent in prescription for 30 mg

## 2018-04-23 ENCOUNTER — Other Ambulatory Visit: Payer: Self-pay | Admitting: Psychiatry

## 2018-04-23 DIAGNOSIS — F900 Attention-deficit hyperactivity disorder, predominantly inattentive type: Secondary | ICD-10-CM

## 2018-04-23 NOTE — Telephone Encounter (Signed)
Last fill 12/04 PMP

## 2018-04-23 NOTE — Telephone Encounter (Signed)
Please escribe

## 2018-04-24 ENCOUNTER — Other Ambulatory Visit: Payer: Self-pay | Admitting: Psychiatry

## 2018-04-24 DIAGNOSIS — F900 Attention-deficit hyperactivity disorder, predominantly inattentive type: Secondary | ICD-10-CM

## 2018-04-24 MED ORDER — LISDEXAMFETAMINE DIMESYLATE 30 MG PO CAPS: 30.0000 mg | ORAL_CAPSULE | Freq: Every day | ORAL | 0 refills | Status: DC

## 2018-04-24 NOTE — Progress Notes (Signed)
3 Vyvanse refills sent to pharmacy as per drug,

## 2018-05-07 ENCOUNTER — Encounter: Payer: Self-pay | Admitting: Emergency Medicine

## 2018-05-07 DIAGNOSIS — F988 Other specified behavioral and emotional disorders with onset usually occurring in childhood and adolescence: Secondary | ICD-10-CM | POA: Insufficient documentation

## 2018-05-22 LAB — OB RESULTS CONSOLE HIV ANTIBODY (ROUTINE TESTING): HIV: NONREACTIVE

## 2018-05-22 LAB — OB RESULTS CONSOLE GC/CHLAMYDIA
Chlamydia: NEGATIVE
Gonorrhea: NEGATIVE

## 2018-05-22 LAB — OB RESULTS CONSOLE ABO/RH: RH Type: POSITIVE

## 2018-05-22 LAB — OB RESULTS CONSOLE ANTIBODY SCREEN: Antibody Screen: NEGATIVE

## 2018-05-22 LAB — OB RESULTS CONSOLE RUBELLA ANTIBODY, IGM: Rubella: NON-IMMUNE/NOT IMMUNE

## 2018-05-22 LAB — OB RESULTS CONSOLE HEPATITIS B SURFACE ANTIGEN: Hepatitis B Surface Ag: NEGATIVE

## 2018-05-22 LAB — OB RESULTS CONSOLE RPR: RPR: NONREACTIVE

## 2018-06-14 ENCOUNTER — Ambulatory Visit: Payer: Self-pay | Admitting: Psychiatry

## 2018-11-14 ENCOUNTER — Encounter (HOSPITAL_COMMUNITY): Payer: Self-pay | Admitting: *Deleted

## 2018-11-14 ENCOUNTER — Other Ambulatory Visit: Payer: Self-pay

## 2018-11-14 ENCOUNTER — Inpatient Hospital Stay (HOSPITAL_COMMUNITY)
Admission: AD | Admit: 2018-11-14 | Discharge: 2018-11-14 | Disposition: A | Discharge status: Home / Self Care | Payer: BLUE CROSS/BLUE SHIELD | Attending: Obstetrics and Gynecology | Admitting: Obstetrics and Gynecology

## 2018-11-14 DIAGNOSIS — Z79899 Other long term (current) drug therapy: Secondary | ICD-10-CM | POA: Insufficient documentation

## 2018-11-14 DIAGNOSIS — F329 Major depressive disorder, single episode, unspecified: Secondary | ICD-10-CM | POA: Insufficient documentation

## 2018-11-14 DIAGNOSIS — M7989 Other specified soft tissue disorders: Secondary | ICD-10-CM | POA: Insufficient documentation

## 2018-11-14 DIAGNOSIS — F419 Anxiety disorder, unspecified: Secondary | ICD-10-CM | POA: Insufficient documentation

## 2018-11-14 DIAGNOSIS — R109 Unspecified abdominal pain: Secondary | ICD-10-CM | POA: Insufficient documentation

## 2018-11-14 DIAGNOSIS — O26893 Other specified pregnancy related conditions, third trimester: Secondary | ICD-10-CM | POA: Insufficient documentation

## 2018-11-14 DIAGNOSIS — O4703 False labor before 37 completed weeks of gestation, third trimester: Secondary | ICD-10-CM

## 2018-11-14 DIAGNOSIS — O99343 Other mental disorders complicating pregnancy, third trimester: Secondary | ICD-10-CM | POA: Insufficient documentation

## 2018-11-14 DIAGNOSIS — Z3A34 34 weeks gestation of pregnancy: Secondary | ICD-10-CM | POA: Insufficient documentation

## 2018-11-14 HISTORY — DX: Depression, unspecified: F32.A

## 2018-11-14 HISTORY — DX: Anxiety disorder, unspecified: F41.9

## 2018-11-14 LAB — URINALYSIS, ROUTINE W REFLEX MICROSCOPIC
Bilirubin Urine: NEGATIVE
Glucose, UA: NEGATIVE mg/dL
Hgb urine dipstick: NEGATIVE
Ketones, ur: NEGATIVE mg/dL
Leukocytes,Ua: NEGATIVE
Nitrite: NEGATIVE
Protein, ur: NEGATIVE mg/dL
Specific Gravity, Urine: 1.011 (ref 1.005–1.030)
pH: 6 (ref 5.0–8.0)

## 2018-11-14 NOTE — MAU Note (Addendum)
Have had swelling in feet for a long time. Face swollen this wk and hands swollen today. Has not had b/p problems. Some lower pelvic pressure. Hard sharp pai in upper abd earlier today but that went away. Headache Sunday that resolved with ice to head

## 2018-11-14 NOTE — Discharge Instructions (Signed)

## 2018-11-14 NOTE — Progress Notes (Signed)
Jorje Guild NP in earlier to discuss d/c plan with pt. WRitten and verbal d/c instructions givn and understanding voiced.

## 2018-11-14 NOTE — MAU Provider Note (Signed)
Chief Complaint:  Edema   First Provider Initiated Contact with Patient 11/14/18 2255     HPI: Maria Yoder is a 28 y.o. G2P1001 at [redacted]w[redacted]d who presents to maternity admissions reporting LE swelling & abdominal pain. Reports increased swelling in BLE & bilateral hands for the last week. Denies hx of hypertension & does not check her BP at home. Denies headache or visual disturbance. Had episode of epigastric pain earlier today that felt like gas & resolved without intervention. Also reports lower abdominal cramping that has been going on for the last week. Has appt at [facility] on Monday. Denies dysuria, vaginal bleeding, or LOF. Good fetal movement. Has had 2 bottles of water today.  Location: abdomen Quality: cramping Severity: 8/10 in pain scale Duration: 1 week Timing: intermittent Modifying factors: none Associated signs and symptoms: none  Past Medical History:  Diagnosis Date  . Anxiety   . Depression    OB History  Gravida Para Term Preterm AB Living  2 1 1     1   SAB TAB Ectopic Multiple Live Births        0 1    # Outcome Date GA Lbr Len/2nd Weight Sex Delivery Anes PTL Lv  2 Current           1 Term 03/02/15 [redacted]w[redacted]d 05:45 / 00:43 3830 g F Vag-Spont EPI, Local  LIV   Past Surgical History:  Procedure Laterality Date  . NO PAST SURGERIES     Family History  Problem Relation Age of Onset  . Thyroid disease Mother   . Hypertension Father   . Heart disease Paternal Aunt   . Thyroid disease Maternal Grandmother   . Hypertension Paternal Grandmother   . Cancer Paternal Grandfather        stomach   Social History   Tobacco Use  . Smoking status: Never Smoker  . Smokeless tobacco: Never Used  Substance Use Topics  . Alcohol use: No  . Drug use: No   No Known Allergies Medications Prior to Admission  Medication Sig Dispense Refill Last Dose  . Prenatal Vit-Fe Fumarate-FA (PRENATAL MULTIVITAMIN) TABS tablet Take 1 tablet by mouth every evening.     11/14/2018 at Unknown time  . sertraline (ZOLOFT) 100 MG tablet Take 100 mg by mouth daily.   11/14/2018 at Unknown time  . ALPRAZolam (XANAX) 0.25 MG tablet Take 0.25 mg by mouth at bedtime as needed for anxiety.     Marland Kitchen amphetamine-dextroamphetamine (ADDERALL) 30 MG tablet Take 30 mg by mouth at bedtime.     Marland Kitchen lisdexamfetamine (VYVANSE) 30 MG capsule Take 1 capsule (30 mg total) by mouth daily. 30 capsule 0   . lisdexamfetamine (VYVANSE) 30 MG capsule Take 1 capsule (30 mg total) by mouth daily. 30 capsule 0   . lisdexamfetamine (VYVANSE) 30 MG capsule Take 1 capsule (30 mg total) by mouth daily. 30 capsule 0     I have reviewed patient's Past Medical Hx, Surgical Hx, Family Hx, Social Hx, medications and allergies.   ROS:  Review of Systems  Constitutional: Negative.   Cardiovascular: Positive for leg swelling.  Gastrointestinal: Positive for abdominal pain. Negative for constipation, diarrhea, nausea and vomiting.  Genitourinary: Negative.     Physical Exam   Patient Vitals for the past 24 hrs:  BP Temp Pulse Resp Height Weight  11/14/18 2315 114/63 - 89 18 - -  11/14/18 2233 (!) 109/56 - 88 - - -  11/14/18 2207 117/70 98.4 F (36.9 C)  99 18 5\' 1"  (1.549 m) 80.7 kg    Constitutional: Well-developed, well-nourished female in no acute distress.  Cardiovascular: normal rate & rhythm, no murmur Respiratory: normal effort, lung sounds clear throughout GI: Abd soft, non-tender, gravid appropriate for gestational age. Pos BS x 4 MS: Extremities nontender, mild non pitting edema of BLE, normal ROM Neurologic: Alert and oriented x 4.  GU:   Dilation: Closed Effacement (%): Thick Exam by:: Judeth HornERin Arlin Sass NP  NST:  Baseline: 135 bpm, Variability: Good {> 6 bpm), Accelerations: Reactive, Decelerations: Absent and ctx Q2-4 minutes   Labs: No results found for this or any previous visit (from the past 24 hour(s)).  Imaging:  No results found.  MAU Course: Orders Placed This  Encounter  Procedures  . Urinalysis, Routine w reflex microscopic  . Discharge patient   No orders of the defined types were placed in this encounter.   MDM: Patient is normotensive in MAU & asymptomatic. No hx of hypertension. Mild edema of BLE consistent with normal pregnancy changes.   Contracting every 2-4 minutes on the monitor. States some are painful but have been ongoing x 1 week. Cervix closed/thick & ctx palpate mild. Offered procardia for patient comfort. Pt declines. Will return if worsens. Has appointment in the office on Monday.   Assessment: 1. Preterm uterine contractions in third trimester, antepartum   2. [redacted] weeks gestation of pregnancy     Plan: Discharge home in stable condition.  Preterm Labor precautions and fetal kick counts Follow-up Information    [facility] Follow up.   Why: keep scheduled appointment Contact information: [address] [place] [address] [phone]        [facility] Follow up.   Specialty: Obstetrics and Gynecology Why: return for worsening symptoms Contact information: [address] [id number] [place] [id number] [phone]          Allergies as of 11/14/2018   No Known Allergies     Medication List    STOP taking these medications   ALPRAZolam 0.25 MG tablet Commonly known as: XANAX   amphetamine-dextroamphetamine 30 MG tablet Commonly known as: ADDERALL   lisdexamfetamine 30 MG capsule Commonly known as: Vyvanse     TAKE these medications   prenatal multivitamin Tabs tablet Take 1 tablet by mouth every evening.   sertraline 100 MG tablet Commonly known as: ZOLOFT Take 100 mg by mouth daily.       Judeth HornLawrence, Destynie Toomey, NP 11/14/2018 11:17 PM

## 2018-11-30 LAB — OB RESULTS CONSOLE GBS: GBS: NEGATIVE

## 2018-12-05 ENCOUNTER — Telehealth (HOSPITAL_COMMUNITY): Payer: Self-pay | Admitting: *Deleted

## 2018-12-05 ENCOUNTER — Encounter (HOSPITAL_COMMUNITY): Payer: Self-pay | Admitting: *Deleted

## 2018-12-05 NOTE — Telephone Encounter (Signed)
Preadmission screen  

## 2018-12-06 ENCOUNTER — Encounter (HOSPITAL_COMMUNITY): Payer: Self-pay | Admitting: *Deleted

## 2018-12-06 NOTE — Patient Instructions (Signed)
Amabel Stmarie  12/06/2018   Your procedure is scheduled on:  12/19/2018  Arrive at 0800 at Entrance C on [address] at [facility]  and [employer] [employer] [address]. You are invited to use the FREE valet parking or use the Visitor's parking deck.  Pick up the phone at the desk and dial [phone].  Call this number if you have problems the morning of surgery: [phone]  Remember:   Do not eat food:(After Midnight) Desps de medianoche.  Do not drink clear liquids: (After Midnight) Desps de medianoche.  Take these medicines the morning of surgery with A SIP OF WATER:  zoloft   Do not wear jewelry, make-up or nail polish.  Do not wear lotions, powders, or perfumes. Do not wear deodorant.  Do not shave 48 hours prior to surgery.  Do not bring valuables to the hospital.  [facility] is not   responsible for any belongings or valuables brought to the hospital.  Contacts, dentures or bridgework may not be worn into surgery.  Leave suitcase in the car. After surgery it may be brought to your room.  For patients admitted to the hospital, checkout time is 11:00 AM the day of              discharge.      Please read over the following fact sheets that you were given:     Preparing for Surgery

## 2018-12-10 NOTE — H&P (Addendum)
Maria Yoder is a 28 y.o. female presenting for primary c-section.  Pregnancy complicated by suspected LGA, pt requests primary c-section.  She also desires permanent sterilization  OB History    Gravida  2   Para  1   Term  1   Preterm      AB      Living  1     SAB      TAB      Ectopic      Multiple  0   Live Births  1          Past Medical History:  Diagnosis Date  . Anxiety   . Depression    PP   Past Surgical History:  Procedure Laterality Date  . NO PAST SURGERIES     Family History: family history includes Cancer in her paternal grandfather; Heart disease in her paternal aunt; Hypertension in her father and paternal grandmother; Thyroid disease in her maternal grandmother and mother. Social History:  reports that she has never smoked. She has never used smokeless tobacco. She reports that she does not drink alcohol or use drugs.     Maternal Diabetes: No Genetic Screening: Normal Maternal Ultrasounds/Referrals: Normal Fetal Ultrasounds or other Referrals:  None Maternal Substance Abuse:  No Significant Maternal Medications sertraline Significant Maternal Lab Results:  Group B Strep negative Other Comments:  None  ROS History   Exam Physical Exam  Gen - NAD Abd - gravid, NT Ext - NT, no edema Cvx - closed Prenatal labs: ABO, Rh: O/Positive/-- (01/28 0000) Antibody: Negative (01/28 0000) Rubella: Nonimmune (01/28 0000) RPR: Nonreactive (01/28 0000)  HBsAg: Negative (01/28 0000)  HIV: Non-reactive (01/28 0000)  GBS: Negative (08/07 0000)   Assessment/Plan: LGA - pt desires primary c-section & permanent sterilization R/b/a discussed, questions answered, informed consent Maria Yoder 12/10/2018, 2:03 PM

## 2018-12-17 ENCOUNTER — Other Ambulatory Visit: Payer: Self-pay

## 2018-12-17 ENCOUNTER — Other Ambulatory Visit (HOSPITAL_COMMUNITY)
Admission: RE | Admit: 2018-12-17 | Discharge: 2018-12-17 | Disposition: A | Discharge status: Home / Self Care | Payer: 59 | Source: Ambulatory Visit | Attending: Obstetrics and Gynecology | Admitting: Obstetrics and Gynecology

## 2018-12-17 DIAGNOSIS — Z20828 Contact with and (suspected) exposure to other viral communicable diseases: Secondary | ICD-10-CM | POA: Insufficient documentation

## 2018-12-17 DIAGNOSIS — Z01812 Encounter for preprocedural laboratory examination: Secondary | ICD-10-CM | POA: Insufficient documentation

## 2018-12-17 LAB — ABO/RH: ABO/RH(D): O POS

## 2018-12-17 LAB — CBC
HCT: 35.2 % — ABNORMAL LOW (ref 36.0–46.0)
Hemoglobin: 11.8 g/dL — ABNORMAL LOW (ref 12.0–15.0)
MCH: 31.4 pg (ref 26.0–34.0)
MCHC: 33.5 g/dL (ref 30.0–36.0)
MCV: 93.6 fL (ref 80.0–100.0)
Platelets: 178 10*3/uL (ref 150–400)
RBC: 3.76 MIL/uL — ABNORMAL LOW (ref 3.87–5.11)
RDW: 13.8 % (ref 11.5–15.5)
WBC: 7.5 10*3/uL (ref 4.0–10.5)
nRBC: 0.3 % — ABNORMAL HIGH (ref 0.0–0.2)

## 2018-12-17 LAB — TYPE AND SCREEN
ABO/RH(D): O POS
Antibody Screen: NEGATIVE

## 2018-12-17 LAB — RPR: RPR Ser Ql: NONREACTIVE

## 2018-12-17 LAB — SARS CORONAVIRUS 2 (TAT 6-24 HRS): SARS Coronavirus 2: NEGATIVE

## 2018-12-17 NOTE — MAU Note (Signed)
Asymptomatic, swab collected. 

## 2018-12-19 ENCOUNTER — Inpatient Hospital Stay (HOSPITAL_COMMUNITY): Payer: 59 | Admitting: Anesthesiology

## 2018-12-19 ENCOUNTER — Inpatient Hospital Stay (HOSPITAL_COMMUNITY)
Admission: AD | Admit: 2018-12-19 | Discharge: 2018-12-21 | DRG: 785 | Disposition: A | Discharge status: Home / Self Care | Payer: 59 | Attending: Obstetrics and Gynecology | Admitting: Obstetrics and Gynecology

## 2018-12-19 ENCOUNTER — Encounter (HOSPITAL_COMMUNITY)
Admission: AD | Disposition: A | Discharge status: Home / Self Care | Payer: Self-pay | Source: Home / Self Care | Attending: Obstetrics and Gynecology

## 2018-12-19 ENCOUNTER — Encounter (HOSPITAL_COMMUNITY): Payer: Self-pay | Admitting: *Deleted

## 2018-12-19 ENCOUNTER — Inpatient Hospital Stay (HOSPITAL_COMMUNITY): Payer: 59

## 2018-12-19 DIAGNOSIS — D649 Anemia, unspecified: Secondary | ICD-10-CM | POA: Diagnosis present

## 2018-12-19 DIAGNOSIS — Z3A39 39 weeks gestation of pregnancy: Secondary | ICD-10-CM

## 2018-12-19 DIAGNOSIS — O9902 Anemia complicating childbirth: Secondary | ICD-10-CM | POA: Diagnosis present

## 2018-12-19 DIAGNOSIS — Z98891 History of uterine scar from previous surgery: Secondary | ICD-10-CM

## 2018-12-19 DIAGNOSIS — O3663X0 Maternal care for excessive fetal growth, third trimester, not applicable or unspecified: Secondary | ICD-10-CM | POA: Diagnosis present

## 2018-12-19 DIAGNOSIS — O99214 Obesity complicating childbirth: Secondary | ICD-10-CM | POA: Diagnosis present

## 2018-12-19 DIAGNOSIS — O3660X0 Maternal care for excessive fetal growth, unspecified trimester, not applicable or unspecified: Secondary | ICD-10-CM | POA: Diagnosis present

## 2018-12-19 DIAGNOSIS — Z302 Encounter for sterilization: Secondary | ICD-10-CM | POA: Diagnosis not present

## 2018-12-19 DIAGNOSIS — E669 Obesity, unspecified: Secondary | ICD-10-CM | POA: Diagnosis present

## 2018-12-19 SURGERY — Surgical Case
Anesthesia: Spinal | Wound class: Clean Contaminated

## 2018-12-19 MED ORDER — OXYCODONE-ACETAMINOPHEN 5-325 MG PO TABS
1.0000 | ORAL_TABLET | ORAL | Status: DC | PRN
  Administered 2018-12-20 (×4): 2 via ORAL
  Administered 2018-12-21 (×2): 1 via ORAL
  Filled 2018-12-19: qty 2
  Filled 2018-12-19: qty 1
  Filled 2018-12-19 (×3): qty 2
  Filled 2018-12-19: qty 1

## 2018-12-19 MED ORDER — MEPERIDINE HCL 25 MG/ML IJ SOLN: 6.2500 mg | INTRAMUSCULAR | Status: DC | PRN

## 2018-12-19 MED ORDER — DIPHENHYDRAMINE HCL 50 MG/ML IJ SOLN
INTRAMUSCULAR | Status: AC
  Filled 2018-12-19: qty 1

## 2018-12-19 MED ORDER — SODIUM CHLORIDE 0.9 % IV SOLN
INTRAVENOUS | Status: DC | PRN
  Administered 2018-12-19: 40 [IU] via INTRAVENOUS

## 2018-12-19 MED ORDER — NALBUPHINE HCL 10 MG/ML IJ SOLN
5.0000 mg | Freq: Once | INTRAMUSCULAR | Status: AC | PRN
  Administered 2018-12-19: 5 mg via INTRAVENOUS
  Filled 2018-12-19: qty 1

## 2018-12-19 MED ORDER — DIBUCAINE (PERIANAL) 1 % EX OINT: 1.0000 "application " | TOPICAL_OINTMENT | CUTANEOUS | Status: DC | PRN

## 2018-12-19 MED ORDER — STERILE WATER FOR IRRIGATION IR SOLN
Status: DC | PRN
  Administered 2018-12-19: 1

## 2018-12-19 MED ORDER — SCOPOLAMINE 1 MG/3DAYS TD PT72: 1.0000 | MEDICATED_PATCH | Freq: Once | TRANSDERMAL | Status: DC

## 2018-12-19 MED ORDER — MORPHINE SULFATE (PF) 0.5 MG/ML IJ SOLN
INTRAMUSCULAR | Status: AC
  Filled 2018-12-19: qty 10

## 2018-12-19 MED ORDER — NALBUPHINE HCL 10 MG/ML IJ SOLN: 5.0000 mg | Freq: Once | INTRAMUSCULAR | Status: AC | PRN

## 2018-12-19 MED ORDER — ONDANSETRON HCL 4 MG/2ML IJ SOLN
INTRAMUSCULAR | Status: DC | PRN
  Administered 2018-12-19: 4 mg via INTRAVENOUS

## 2018-12-19 MED ORDER — SIMETHICONE 80 MG PO CHEW
80.0000 mg | CHEWABLE_TABLET | ORAL | Status: DC
  Administered 2018-12-19 – 2018-12-20 (×2): 80 mg via ORAL
  Filled 2018-12-19 (×2): qty 1

## 2018-12-19 MED ORDER — KETOROLAC TROMETHAMINE 30 MG/ML IJ SOLN
30.0000 mg | Freq: Four times a day (QID) | INTRAMUSCULAR | Status: AC | PRN
  Administered 2018-12-19: 30 mg via INTRAMUSCULAR

## 2018-12-19 MED ORDER — KETOROLAC TROMETHAMINE 30 MG/ML IJ SOLN
30.0000 mg | Freq: Four times a day (QID) | INTRAMUSCULAR | Status: AC | PRN
  Administered 2018-12-19: 30 mg via INTRAVENOUS
  Filled 2018-12-19: qty 1

## 2018-12-19 MED ORDER — KETOROLAC TROMETHAMINE 30 MG/ML IJ SOLN
INTRAMUSCULAR | Status: AC
  Filled 2018-12-19: qty 1

## 2018-12-19 MED ORDER — ONDANSETRON HCL 4 MG/2ML IJ SOLN: 4.0000 mg | Freq: Three times a day (TID) | INTRAMUSCULAR | Status: DC | PRN

## 2018-12-19 MED ORDER — FENTANYL CITRATE (PF) 100 MCG/2ML IJ SOLN
INTRAMUSCULAR | Status: AC
  Filled 2018-12-19: qty 2

## 2018-12-19 MED ORDER — SIMETHICONE 80 MG PO CHEW: 80.0000 mg | CHEWABLE_TABLET | ORAL | Status: DC | PRN

## 2018-12-19 MED ORDER — NALOXONE HCL 4 MG/10ML IJ SOLN
1.0000 ug/kg/h | INTRAVENOUS | Status: DC | PRN
  Filled 2018-12-19: qty 5

## 2018-12-19 MED ORDER — FENTANYL CITRATE (PF) 100 MCG/2ML IJ SOLN
INTRAMUSCULAR | Status: DC | PRN
  Administered 2018-12-19: 15 ug via INTRATHECAL

## 2018-12-19 MED ORDER — COCONUT OIL OIL: 1.0000 "application " | TOPICAL_OIL | Status: DC | PRN

## 2018-12-19 MED ORDER — SOD CITRATE-CITRIC ACID 500-334 MG/5ML PO SOLN
30.0000 mL | Freq: Once | ORAL | Status: AC
  Administered 2018-12-19: 30 mL via ORAL

## 2018-12-19 MED ORDER — MEDROXYPROGESTERONE ACETATE 150 MG/ML IM SUSP: 150.0000 mg | INTRAMUSCULAR | Status: DC | PRN

## 2018-12-19 MED ORDER — SODIUM CHLORIDE 0.9 % IV SOLN
INTRAVENOUS | Status: DC | PRN
  Administered 2018-12-19: 10:00:00 via INTRAVENOUS

## 2018-12-19 MED ORDER — SODIUM CHLORIDE 0.9 % IR SOLN
Status: DC | PRN
  Administered 2018-12-19: 1

## 2018-12-19 MED ORDER — CEFAZOLIN SODIUM-DEXTROSE 2-4 GM/100ML-% IV SOLN
2.0000 g | INTRAVENOUS | Status: AC
  Administered 2018-12-19: 2 g via INTRAVENOUS

## 2018-12-19 MED ORDER — NALOXONE HCL 0.4 MG/ML IJ SOLN: 0.4000 mg | INTRAMUSCULAR | Status: DC | PRN

## 2018-12-19 MED ORDER — PRENATAL MULTIVITAMIN CH
1.0000 | ORAL_TABLET | Freq: Every day | ORAL | Status: DC
  Administered 2018-12-20: 1 via ORAL
  Filled 2018-12-19: qty 1

## 2018-12-19 MED ORDER — LACTATED RINGERS IV SOLN
INTRAVENOUS | Status: DC
  Administered 2018-12-19: 09:00:00 via INTRAVENOUS

## 2018-12-19 MED ORDER — DIPHENHYDRAMINE HCL 50 MG/ML IJ SOLN
12.5000 mg | INTRAMUSCULAR | Status: DC | PRN
  Administered 2018-12-19: 12.5 mg via INTRAVENOUS

## 2018-12-19 MED ORDER — MEASLES, MUMPS & RUBELLA VAC IJ SOLR: 0.5000 mL | Freq: Once | INTRAMUSCULAR | Status: DC

## 2018-12-19 MED ORDER — FAMOTIDINE 20 MG PO TABS
20.0000 mg | ORAL_TABLET | Freq: Once | ORAL | Status: AC
  Administered 2018-12-19: 20 mg via ORAL

## 2018-12-19 MED ORDER — TETANUS-DIPHTH-ACELL PERTUSSIS 5-2.5-18.5 LF-MCG/0.5 IM SUSP: 0.5000 mL | Freq: Once | INTRAMUSCULAR | Status: DC

## 2018-12-19 MED ORDER — SODIUM CHLORIDE 0.9% FLUSH: 3.0000 mL | INTRAVENOUS | Status: DC | PRN

## 2018-12-19 MED ORDER — DIPHENHYDRAMINE HCL 25 MG PO CAPS
25.0000 mg | ORAL_CAPSULE | ORAL | Status: DC | PRN
  Filled 2018-12-19: qty 1

## 2018-12-19 MED ORDER — SERTRALINE HCL 100 MG PO TABS
100.0000 mg | ORAL_TABLET | Freq: Every day | ORAL | Status: DC
  Administered 2018-12-19 – 2018-12-20 (×2): 100 mg via ORAL
  Filled 2018-12-19 (×2): qty 1

## 2018-12-19 MED ORDER — FENTANYL CITRATE (PF) 100 MCG/2ML IJ SOLN: 25.0000 ug | INTRAMUSCULAR | Status: DC | PRN

## 2018-12-19 MED ORDER — PHENYLEPHRINE HCL-NACL 20-0.9 MG/250ML-% IV SOLN
INTRAVENOUS | Status: AC
  Filled 2018-12-19: qty 250

## 2018-12-19 MED ORDER — IBUPROFEN 600 MG PO TABS
600.0000 mg | ORAL_TABLET | Freq: Four times a day (QID) | ORAL | Status: DC | PRN
  Administered 2018-12-20 (×2): 600 mg via ORAL
  Filled 2018-12-19 (×2): qty 1

## 2018-12-19 MED ORDER — ONDANSETRON HCL 4 MG/2ML IJ SOLN
INTRAMUSCULAR | Status: AC
  Filled 2018-12-19: qty 2

## 2018-12-19 MED ORDER — NALBUPHINE HCL 10 MG/ML IJ SOLN
5.0000 mg | INTRAMUSCULAR | Status: DC | PRN
  Administered 2018-12-19 – 2018-12-20 (×2): 5 mg via INTRAVENOUS
  Filled 2018-12-19 (×2): qty 1

## 2018-12-19 MED ORDER — MORPHINE SULFATE (PF) 0.5 MG/ML IJ SOLN
INTRAMUSCULAR | Status: DC | PRN
  Administered 2018-12-19: .15 mg via INTRATHECAL

## 2018-12-19 MED ORDER — WITCH HAZEL-GLYCERIN EX PADS: 1.0000 "application " | MEDICATED_PAD | CUTANEOUS | Status: DC | PRN

## 2018-12-19 MED ORDER — SENNOSIDES-DOCUSATE SODIUM 8.6-50 MG PO TABS
2.0000 | ORAL_TABLET | ORAL | Status: DC
  Administered 2018-12-19 – 2018-12-20 (×2): 2 via ORAL
  Filled 2018-12-19 (×2): qty 2

## 2018-12-19 MED ORDER — PHENYLEPHRINE HCL-NACL 20-0.9 MG/250ML-% IV SOLN
INTRAVENOUS | Status: DC | PRN
  Administered 2018-12-19: 60 ug/min via INTRAVENOUS

## 2018-12-19 MED ORDER — FAMOTIDINE IN NACL 20-0.9 MG/50ML-% IV SOLN
20.0000 mg | Freq: Once | INTRAVENOUS | Status: DC
  Filled 2018-12-19: qty 50

## 2018-12-19 MED ORDER — ACETAMINOPHEN 500 MG PO TABS
1000.0000 mg | ORAL_TABLET | Freq: Four times a day (QID) | ORAL | Status: AC
  Administered 2018-12-19 (×3): 1000 mg via ORAL
  Filled 2018-12-19 (×4): qty 2

## 2018-12-19 MED ORDER — OXYTOCIN 40 UNITS IN NORMAL SALINE INFUSION - SIMPLE MED: 2.5000 [IU]/h | INTRAVENOUS | Status: AC

## 2018-12-19 MED ORDER — LACTATED RINGERS IV SOLN
INTRAVENOUS | Status: DC | PRN
  Administered 2018-12-19 (×2): via INTRAVENOUS

## 2018-12-19 MED ORDER — SOD CITRATE-CITRIC ACID 500-334 MG/5ML PO SOLN
ORAL | Status: AC
  Filled 2018-12-19: qty 30

## 2018-12-19 MED ORDER — CEFAZOLIN SODIUM-DEXTROSE 2-4 GM/100ML-% IV SOLN
INTRAVENOUS | Status: AC
  Filled 2018-12-19: qty 100

## 2018-12-19 MED ORDER — ACETAMINOPHEN 325 MG PO TABS: 650.0000 mg | ORAL_TABLET | ORAL | Status: DC | PRN

## 2018-12-19 MED ORDER — DIPHENHYDRAMINE HCL 25 MG PO CAPS
25.0000 mg | ORAL_CAPSULE | Freq: Four times a day (QID) | ORAL | Status: DC | PRN
  Administered 2018-12-19: 25 mg via ORAL

## 2018-12-19 MED ORDER — SIMETHICONE 80 MG PO CHEW
80.0000 mg | CHEWABLE_TABLET | Freq: Three times a day (TID) | ORAL | Status: DC
  Administered 2018-12-19 – 2018-12-21 (×5): 80 mg via ORAL
  Filled 2018-12-19 (×5): qty 1

## 2018-12-19 MED ORDER — FAMOTIDINE 20 MG PO TABS
ORAL_TABLET | ORAL | Status: AC
  Filled 2018-12-19: qty 1

## 2018-12-19 MED ORDER — SCOPOLAMINE 1 MG/3DAYS TD PT72
MEDICATED_PATCH | TRANSDERMAL | Status: AC
  Filled 2018-12-19: qty 1

## 2018-12-19 MED ORDER — SCOPOLAMINE 1 MG/3DAYS TD PT72
1.0000 | MEDICATED_PATCH | TRANSDERMAL | Status: DC
  Administered 2018-12-19: 1.5 mg via TRANSDERMAL

## 2018-12-19 MED ORDER — PROMETHAZINE HCL 25 MG/ML IJ SOLN: 6.2500 mg | INTRAMUSCULAR | Status: DC | PRN

## 2018-12-19 MED ORDER — DEXTROSE IN LACTATED RINGERS 5 % IV SOLN
INTRAVENOUS | Status: DC
  Administered 2018-12-19: 21:00:00 via INTRAVENOUS

## 2018-12-19 MED ORDER — MENTHOL 3 MG MT LOZG: 1.0000 | LOZENGE | OROMUCOSAL | Status: DC | PRN

## 2018-12-19 MED ORDER — NALBUPHINE HCL 10 MG/ML IJ SOLN: 5.0000 mg | INTRAMUSCULAR | Status: DC | PRN

## 2018-12-19 MED ORDER — BUPIVACAINE IN DEXTROSE 0.75-8.25 % IT SOLN
INTRATHECAL | Status: DC | PRN
  Administered 2018-12-19: 1.6 mg via INTRATHECAL

## 2018-12-19 SURGICAL SUPPLY — 32 items
CHLORAPREP W/TINT 26ML (MISCELLANEOUS) ×3 IMPLANT
CLAMP CORD UMBIL (MISCELLANEOUS) IMPLANT
CLOTH BEACON ORANGE TIMEOUT ST (SAFETY) ×3 IMPLANT
DERMABOND ADHESIVE PROPEN (GAUZE/BANDAGES/DRESSINGS)
DERMABOND ADVANCED (GAUZE/BANDAGES/DRESSINGS) ×2
DERMABOND ADVANCED .7 DNX12 (GAUZE/BANDAGES/DRESSINGS) ×1 IMPLANT
DERMABOND ADVANCED .7 DNX6 (GAUZE/BANDAGES/DRESSINGS) IMPLANT
DRSG OPSITE POSTOP 4X10 (GAUZE/BANDAGES/DRESSINGS) ×3 IMPLANT
ELECT REM PT RETURN 9FT ADLT (ELECTROSURGICAL) ×3
ELECTRODE REM PT RTRN 9FT ADLT (ELECTROSURGICAL) ×1 IMPLANT
EXTRACTOR VACUUM M CUP 4 TUBE (SUCTIONS) IMPLANT
EXTRACTOR VACUUM M CUP 4' TUBE (SUCTIONS)
GLOVE BIO SURGEON STRL SZ 6.5 (GLOVE) ×2 IMPLANT
GLOVE BIO SURGEONS STRL SZ 6.5 (GLOVE) ×1
GLOVE BIOGEL PI IND STRL 7.0 (GLOVE) ×2 IMPLANT
GLOVE BIOGEL PI INDICATOR 7.0 (GLOVE) ×4
GOWN STRL REUS W/TWL LRG LVL3 (GOWN DISPOSABLE) ×6 IMPLANT
KIT ABG SYR 3ML LUER SLIP (SYRINGE) IMPLANT
NEEDLE HYPO 25X5/8 SAFETYGLIDE (NEEDLE) IMPLANT
NS IRRIG 1000ML POUR BTL (IV SOLUTION) ×3 IMPLANT
PACK C SECTION WH (CUSTOM PROCEDURE TRAY) ×3 IMPLANT
PAD OB MATERNITY 4.3X12.25 (PERSONAL CARE ITEMS) ×3 IMPLANT
PENCIL SMOKE EVAC W/HOLSTER (ELECTROSURGICAL) ×3 IMPLANT
SUT CHROMIC 0 CT 802H (SUTURE) IMPLANT
SUT CHROMIC 0 CTX 36 (SUTURE) ×12 IMPLANT
SUT MON AB-0 CT1 36 (SUTURE) ×3 IMPLANT
SUT PDS AB 0 CTX 60 (SUTURE) ×3 IMPLANT
SUT PLAIN 0 NONE (SUTURE) IMPLANT
SUT VIC AB 4-0 KS 27 (SUTURE) IMPLANT
TOWEL OR 17X24 6PK STRL BLUE (TOWEL DISPOSABLE) ×3 IMPLANT
TRAY FOLEY W/BAG SLVR 14FR LF (SET/KITS/TRAYS/PACK) IMPLANT
WATER STERILE IRR 1000ML POUR (IV SOLUTION) ×3 IMPLANT

## 2018-12-19 NOTE — Anesthesia Postprocedure Evaluation (Signed)
Anesthesia Post Note  Patient: Nature conservation officer  Procedure(s) Performed: CESAREAN SECTION WITH BILATERAL TUBAL LIGATION (N/A )     Patient location during evaluation: PACU Anesthesia Type: Spinal Level of consciousness: oriented, awake and alert and awake Pain management: pain level controlled Vital Signs Assessment: post-procedure vital signs reviewed and stable Respiratory status: spontaneous breathing, respiratory function stable, patient connected to nasal cannula oxygen and nonlabored ventilation Cardiovascular status: blood pressure returned to baseline and stable Postop Assessment: no headache, no backache, no apparent nausea or vomiting, spinal receding and patient able to bend at knees Anesthetic complications: no    Last Vitals:  Vitals:   12/19/18 1223 12/19/18 1325  BP: 123/75 116/78  Pulse: 70 72  Resp: 18 16  Temp: 36.7 C 36.9 C  SpO2: 100% 100%    Last Pain:  Vitals:   12/19/18 1325  TempSrc: Oral  PainSc:    Pain Goal: Patients Stated Pain Goal: 4 (12/19/18 4944)              Epidural/Spinal Function Cutaneous sensation: (P) Tingles (12/19/18 1330), Patient able to flex knees: (P) Yes (12/19/18 1330), Patient able to lift hips off bed: (P) No (12/19/18 1330), Back pain beyond tenderness at insertion site: (P) No (12/19/18 1330), Progressively worsening motor and/or sensory loss: (P) No (12/19/18 1330), Bowel and/or bladder incontinence post epidural: (P) No (12/19/18 1330)  Catalina Gravel

## 2018-12-19 NOTE — Anesthesia Procedure Notes (Signed)
Spinal  Patient location during procedure: OR Start time: 12/19/2018 9:31 AM End time: 12/19/2018 9:34 AM Staffing Anesthesiologist: Catalina Gravel, MD Performed: anesthesiologist  Preanesthetic Checklist Completed: patient identified, surgical consent, pre-op evaluation, timeout performed, IV checked, risks and benefits discussed and monitors and equipment checked Spinal Block Patient position: sitting Prep: site prepped and draped and DuraPrep Patient monitoring: continuous pulse ox and blood pressure Approach: midline Location: L3-4 Injection technique: single-shot Needle Needle type: Pencan  Needle gauge: 24 G Assessment Sensory level: T6 Additional Notes Functioning IV was confirmed and monitors were applied. Sterile prep and drape, including hand hygiene, mask and sterile gloves were used. The patient was positioned and the spine was prepped. The skin was anesthetized with lidocaine.  Free flow of clear CSF was obtained prior to injecting local anesthetic into the CSF.  The spinal needle aspirated freely following injection.  The needle was carefully withdrawn.  The patient tolerated the procedure well. Consent was obtained prior to procedure with all questions answered and concerns addressed. Risks including but not limited to bleeding, infection, nerve damage, paralysis, failed block, inadequate analgesia, allergic reaction, high spinal, itching and headache were discussed and the patient wished to proceed.   Hoy Morn, MD

## 2018-12-19 NOTE — Lactation Note (Addendum)
This note was copied from a baby's chart. Lactation Consultation Note  Patient Name: Maria Yoder Date: 12/19/2018 Reason for consult: Initial assessment;Term  1033 - I conducted an initial consult with multip patient who breast fed her daughter, now 3, for seven months. She states that baby has been latching frequently and she was concerned that he was not getting enough to eat. She provided 3 mls by bottle and he was sleeping on her chest upon entry.  I did quite a bit of breast education including day 1 and day 2 normal infant feeding patterns and how to know if baby is getting enough to eat. I encouraged mom to breast feed 8-12 times a day on demand and only supplement after if needed. Her goal is primarily to breast feed.  Mom has a personal pump at home. Her significant other is supportive and involved. Her confidence seemed improved at the end of the visit, and I encouraged to request support from lactation as needed.   Room was dark, and patient was preparing for sleep. I did not get an opportunity to do a breast assessment.    Maternal Data Formula Feeding for Exclusion: No Does the patient have breastfeeding experience prior to this delivery?: Yes  Feeding Feeding Type: Breast Fed Nipple Type: Regular   Interventions Interventions: Breast feeding basics reviewed   Consult Status Consult Status: Follow-up Date: 12/20/18 Follow-up type: In-patient    Maria Yoder 12/19/2018, 10:53 PM

## 2018-12-19 NOTE — Transfer of Care (Signed)
Immediate Anesthesia Transfer of Care Note  Patient: Maria Yoder  Procedure(s) Performed: CESAREAN SECTION WITH BILATERAL TUBAL LIGATION (N/A )  Patient Location: PACU  Anesthesia Type:Spinal  Level of Consciousness: awake  Airway & Oxygen Therapy: Patient Spontanous Breathing  Post-op Assessment: Report given to RN and Post -op Vital signs reviewed and stable  Post vital signs: stable  Last Vitals:  Vitals Value Taken Time  BP 115/47 12/19/18 1036  Temp    Pulse 87 12/19/18 1037  Resp    SpO2 100 % 12/19/18 1037  Vitals shown include unvalidated device data.  Last Pain:  Vitals:   12/19/18 0837  TempSrc: Oral  PainSc: 0-No pain      Patients Stated Pain Goal: 4 (35/57/32 2025)  Complications: No apparent anesthesia complications

## 2018-12-19 NOTE — Op Note (Signed)
Cesarean Section Procedure Note   Maria Yoder  12/19/2018  Indications: Scheduled Proceedure/Maternal Request   Pre-operative Diagnosis: suspected macrosomia, desires sterilization.   Post-operative Diagnosis: Same   Surgeon: Surgeon(s) and Role:    [name], MD - Primary   Assistants: Jill Side, RNFA  Anesthesia: spinal   Procedure Details:  The patient was seen in the Holding Room. The risks, benefits, complications, treatment options, and expected outcomes were discussed with the patient. The patient concurred with the proposed plan, giving informed consent. identified as Maria Yoder and the procedure verified as C-Section Delivery. A Time Out was held and the above information confirmed.  After induction of anesthesia, the patient was draped and prepped in the usual sterile manner. A transverse was made and carried down through the subcutaneous tissue to the fascia. Fascial incision was made and extended transversely. The fascia was separated from the underlying rectus tissue superiorly and inferiorly. The peritoneum was identified and entered. Peritoneal incision was extended longitudinally. The utero-vesical peritoneal reflection was incised transversely and the bladder flap was bluntly freed from the lower uterine segment. A low transverse uterine incision was made. Delivered from cephalic presentation was a viable female infant. Cord ph was not sent the umbilical cord was clamped and cut cord blood was obtained for evaluation. The placenta was removed Intact and appeared normal. The uterine outline, tubes and ovaries appeared normal}. The uterine incision was closed with running locked sutures of 0chromic gut.   Hemostasis was observed. Lavage was carried out until clear.  The right fallopian tube was grasped with a babcock.  A knuckle of tube was doubly tied using plain gut suture.  Portion was excised and handed off for pathology.  Hemostasis was noted.  Procedure was  repeated on the left fallopian tube with hemostasis.  Peritoneum closed with 0 monocryl.   The fascia was then reapproximated with running sutures of 0PDS. The skin was closed with 4-0Vicryl.   Instrument, sponge, and needle counts were correct prior the abdominal closure and were correct at the conclusion of the case.     Estimated Blood Loss: 381cc  Urine Output: clear  Specimens: segments of bilateral fallopian tubes   Complications: no complications  Disposition: PACU - hemodynamically stable.   Maternal Condition: stable   Baby condition / location:  Couplet care / Skin to Skin  Attending Attestation: I was present and scrubbed for the entire procedure.   Signed: Surgeon(s): [name], MD

## 2018-12-19 NOTE — Anesthesia Preprocedure Evaluation (Signed)
Anesthesia Evaluation  Patient identified by MRN, date of birth, ID band Patient awake    Reviewed: Allergy & Precautions, NPO status , Patient's Chart, lab work & pertinent test results  Airway Mallampati: II  TM Distance: >3 FB Neck ROM: Full    Dental  (+) Teeth Intact, Dental Advisory Given   Pulmonary neg pulmonary ROS,  Covid - 8/24   Pulmonary exam normal breath sounds clear to auscultation       Cardiovascular negative cardio ROS Normal cardiovascular exam Rhythm:Regular Rate:Normal     Neuro/Psych PSYCHIATRIC DISORDERS Anxiety Depression negative neurological ROS     GI/Hepatic negative GI ROS, Neg liver ROS,   Endo/Other  Obesity   Renal/GU negative Renal ROS     Musculoskeletal negative musculoskeletal ROS (+)   Abdominal   Peds  Hematology  (+) Blood dyscrasia, anemia , P{lt 178k   Anesthesia Other Findings Day of surgery medications reviewed with the patient.  Reproductive/Obstetrics (+) Pregnancy                             Anesthesia Physical Anesthesia Plan  ASA: II  Anesthesia Plan: Spinal   Post-op Pain Management:    Induction:   PONV Risk Score and Plan: 2 and Treatment may vary due to age or medical condition  Airway Management Planned: Natural Airway  Additional Equipment:   Intra-op Plan:   Post-operative Plan:   Informed Consent: I have reviewed the patients History and Physical, chart, labs and discussed the procedure including the risks, benefits and alternatives for the proposed anesthesia with the patient or authorized representative who has indicated his/her understanding and acceptance.     Dental advisory given  Plan Discussed with: CRNA, Anesthesiologist and Surgeon  Anesthesia Plan Comments: (Discussed risks and benefits of and differences between spinal and general. Discussed risks of spinal including headache, backache, failure,  bleeding, infection, and nerve damage. Patient consents to spinal. Questions answered. Coagulation studies and platelet count acceptable.)        Anesthesia Quick Evaluation

## 2018-12-20 LAB — CBC
HCT: 31.2 % — ABNORMAL LOW (ref 36.0–46.0)
Hemoglobin: 10.5 g/dL — ABNORMAL LOW (ref 12.0–15.0)
MCH: 31.4 pg (ref 26.0–34.0)
MCHC: 33.7 g/dL (ref 30.0–36.0)
MCV: 93.4 fL (ref 80.0–100.0)
Platelets: 144 10*3/uL — ABNORMAL LOW (ref 150–400)
RBC: 3.34 MIL/uL — ABNORMAL LOW (ref 3.87–5.11)
RDW: 13.9 % (ref 11.5–15.5)
WBC: 8.9 10*3/uL (ref 4.0–10.5)
nRBC: 0 % (ref 0.0–0.2)

## 2018-12-20 LAB — BIRTH TISSUE RECOVERY COLLECTION (PLACENTA DONATION)

## 2018-12-20 MED ORDER — HYDROMORPHONE HCL 2 MG PO TABS
2.0000 mg | ORAL_TABLET | Freq: Once | ORAL | Status: AC
  Administered 2018-12-20: 2 mg via ORAL
  Filled 2018-12-20: qty 1

## 2018-12-20 MED ORDER — IBUPROFEN 600 MG PO TABS
600.0000 mg | ORAL_TABLET | Freq: Four times a day (QID) | ORAL | Status: DC
  Administered 2018-12-21 (×3): 600 mg via ORAL
  Filled 2018-12-20 (×4): qty 1

## 2018-12-20 NOTE — Progress Notes (Signed)
Subjective: Postpartum Day 1: Cesarean Delivery Patient reports tolerating PO and no problems voiding.    Objective: Vital signs in last 24 hours: Temp:  [98 F (36.7 C)-98.5 F (36.9 C)] 98.1 F (36.7 C) (08/27 0811) Pulse Rate:  [70-92] 73 (08/27 0811) Resp:  [17-18] 18 (08/27 0811) BP: (102-119)/(67-73) 119/73 (08/27 0811) SpO2:  [95 %-99 %] 96 % (08/27 0400)  Physical Exam:  General: alert, cooperative and appears stated age Lochia: appropriate Uterine Fundus: firm Incision: healing well, no significant drainage, no dehiscence DVT Evaluation: No evidence of DVT seen on physical exam. Negative Homan's sign. No cords or calf tenderness.  Recent Labs    12/20/18 0600  HGB 10.5*  HCT 31.2*    Assessment/Plan: Status post Cesarean section. Doing well postoperatively.  Continue current care.  Linda Hedges 12/20/2018, 1:44 PM

## 2018-12-20 NOTE — Plan of Care (Signed)
Pt. Condition will continue to improve 

## 2018-12-20 NOTE — Lactation Note (Signed)
This note was copied from a baby's chart. Lactation Consultation Note  Patient Name: Maria Yoder Date: 12/20/2018 Reason for consult: Follow-up assessment;Term  1754 - 20 - I followed up with Maria Yoder as per RN request. Maria Yoder has been sleepy today since the circumcision. Baby is able to latch, but has been pulling off the breast and yanking/pulling back on the breast.  I assisted with latching baby in cross cradle hold, and I showed mom how to make a U hold with the breast. Baby was having a difficult time settling down but shows great latch capability. I was able to help baby settle into rhythmic suckling sequences for about 10 minutes. He then released the breast and shortly thereafter began to root/fuss.  Baby was 6% weight loss this morning and is now 59 hours old. Baby has received a few bottles today using one of the faster flow nipples. Dad said that baby guzzles the milk. My hunch is that baby is having a difficult time settling at the breast due to milk flow differences.  We supplemented baby using a slow flow (gold) nipple and paced bottle feeding technique. He took 15 mls, and then continued to fuss a bit. Dad ultimately gave about 20 mls. I discussed offering more if he does not settle more within the hour.  Baby went back to breast and latched briefly.  I set up a DEBP and instructed mom to try to breast feed baby prior to supplementation and pump following, particularly if baby does not latch well. I provided supplementation guidelines and reviewed them.  I tried to provide reassurance. Mom has experience breast feeding and her supply was adequate with her first child, now [age].  Maternal Data Formula Feeding for Exclusion: No Has patient been taught Hand Expression?: Yes Does the patient have breastfeeding experience prior to this delivery?: Yes  Feeding Feeding Type: Breast Milk with Formula added Nipple Type: Slow - flow  LATCH Score Latch: Grasps  breast easily, tongue down, lips flanged, rhythmical sucking.  Audible Swallowing: A few with stimulation  Type of Nipple: Everted at rest and after stimulation  Comfort (Breast/Nipple): Soft / non-tender  Hold (Positioning): Assistance needed to correctly position infant at breast and maintain latch.  LATCH Score: 8  Interventions Interventions: Breast feeding basics reviewed;Assisted with latch;Skin to skin;Hand express;Adjust position;Position options;DEBP  Lactation Tools Discussed/Used Tools: Pump;Bottle Pump Review: Setup, frequency, and cleaning Initiated by:: hl Date initiated:: 12/21/18   Consult Status Consult Status: Follow-up Date: 12/21/18 Follow-up type: In-patient    Lenore Manner 12/20/2018, 7:12 PM

## 2018-12-20 NOTE — Progress Notes (Signed)
Patient desires circ.  She is counseled re: risk of bleeding, infection, and scarring.  All questions were answered and patient wishes to proceed.  Linda Hedges, DO

## 2018-12-21 MED ORDER — OXYCODONE-ACETAMINOPHEN 5-325 MG PO TABS: 1.0000 | ORAL_TABLET | ORAL | 0 refills | Status: DC | PRN

## 2018-12-21 MED ORDER — IBUPROFEN 600 MG PO TABS: 600.0000 mg | ORAL_TABLET | Freq: Four times a day (QID) | ORAL | 0 refills | Status: DC | PRN

## 2018-12-21 NOTE — Progress Notes (Signed)
CSW went to speak with [name] at bedside. CSW made aware by FOB that [name] was in restroom. CSW expressed that CS would return later.      Virgie Dad Jamarcus Laduke, MSW, LCSW [facility] [place] [phone]

## 2018-12-21 NOTE — Progress Notes (Signed)
CSW received consult due to score 14 on Edinburgh Depression Screen.    CSW congratulated [name] and FOB on the birth of infant. CSW advised [name] of the reason for the visit. [name] reported that pregnancy is hard for her and she becomes depressed once pregnant. 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 [name] reported that she was placed on Zoloft at 6 months and this has been very helpful for her. Per husband, "its been like night and day with her". CSW encouraged [name] to use medication as needed and keep in contact with therapist at [facility]. [name] reports that she is expected to see therapist in September for appointment. [name] reports having all needed items for infant with no further concerns at this time.   [name] plans for infant to have follow up care at [facility] in [place].   CSW provided education regarding Baby Blues vs PMADs and provided [name] with resources for mental health follow up.  CSW encouraged [name] to evaluate her mental health throughout the postpartum period with the use of the New Mom Checklist developed by Postpartum Progress as well as the Edinburgh Postnatal Depression Scale and notify a medical professional if symptoms arise.       Maria Yoder S. Maryann Mccall, MSW, LCSW [facility] [place] [phone]   

## 2018-12-21 NOTE — Lactation Note (Signed)
This note was copied from a baby's chart. Lactation Consultation Note  Patient Name: Maria Yoder [id number] Date: 12/21/2018 Reason for consult: Follow-up assessment;Infant weight loss;Other (Comment);Term;Nipple pain/trauma(7% weight loss) Baby is 46 hours old  Per mom baby last fed at 0800 for 15 mins. Presently sound asleep.  Per mom having some nipple soreness and has been using comfort gels she had from home. ( due to the discoloration and the decreased size).  LC instructed on a new pair for 6 days alternating with breast shells except at night.  Sore nipple and engorgement prevention and tx reviewed.  Per mom has a DEBP at home and a hand pump.  Per mom the baby has gotten use to the quick flow with the bottle.  LC recommended watching for early feeding cues and latch early.  If the baby wakes up and is really hungry may need to give and appetizer for EBM or formula 10 ml and then latch. LC stressed the importance of taking the baby to the breast calm.  Mom aware of the Lactation resources after D/C for [facility].   Maternal Data Has patient been taught Hand Expression?: Yes(LC reviewed)  Feeding    LATCH Score                   Interventions Interventions: Breast feeding basics reviewed;Shells;Comfort gels;DEBP  Lactation Tools Discussed/Used Tools: Shells;Pump;Comfort gels(LC instructed mom on the use of comfort gels and shells) Shell Type: Inverted Breast pump type: Double-Electric Breast Pump Pump Review: Milk Storage   Consult Status Consult Status: Complete Date: 12/21/18    Myer Haff 12/21/2018, 11:33 AM

## 2018-12-21 NOTE — Discharge Summary (Signed)
Obstetric Discharge Summary Reason for Admission: cesarean section Prenatal Procedures: none Intrapartum Procedures: cesarean: low cervical, transverse and tubal ligation Postpartum Procedures: none Complications-Operative and Postpartum: none Hemoglobin  Date Value Ref Range Status  12/20/2018 10.5 (L) 12.0 - 15.0 g/dL Final   HCT  Date Value Ref Range Status  12/20/2018 31.2 (L) 36.0 - 46.0 % Final    Physical Exam:  General: alert, cooperative, appears stated age and no distress Lochia: appropriate Uterine Fundus: firm Incision: healing well DVT Evaluation: No evidence of DVT seen on physical exam.  Discharge Diagnoses: Term Pregnancy-delivered  Discharge Information: Date: 12/21/2018 Activity: pelvic rest Diet: routine Medications: Ibuprofen and Percocet Condition: stable Instructions: refer to practice specific booklet Discharge to: home   Newborn Data: Live born female  Birth Weight: 9 lb 8 oz (4310 g) APGAR: 8, 8  Newborn Delivery   Birth date/time: 12[birth year]/2018 09:53:00 Delivery type: C-Section, Low Transverse Trial of labor: No C-section categorization: Primary      Home with mother.  Luz Lex 12/21/2018, 9:24 AM

## 2019-03-20 ENCOUNTER — Encounter (HOSPITAL_COMMUNITY): Payer: Self-pay

## 2019-11-07 ENCOUNTER — Encounter: Payer: Self-pay | Admitting: Adult Health

## 2019-11-07 ENCOUNTER — Other Ambulatory Visit: Payer: Self-pay

## 2019-11-07 ENCOUNTER — Ambulatory Visit (INDEPENDENT_AMBULATORY_CARE_PROVIDER_SITE_OTHER): Payer: 59 | Admitting: Adult Health

## 2019-11-07 VITALS — BP 122/74 | HR 80 | Ht 61.0 in | Wt 170.0 lb

## 2019-11-07 DIAGNOSIS — F900 Attention-deficit hyperactivity disorder, predominantly inattentive type: Secondary | ICD-10-CM | POA: Diagnosis not present

## 2019-11-07 MED ORDER — LISDEXAMFETAMINE DIMESYLATE 30 MG PO CAPS: 30.0000 mg | ORAL_CAPSULE | Freq: Every day | ORAL | 0 refills | Status: DC

## 2019-11-07 NOTE — Progress Notes (Signed)
[facility] MD/PA/NP Initial Note  11/07/2019 1:08 PM Maria Yoder  MRN:  [id number]  Chief Complaint:   HPI:  Describes mood today as - Mood symptoms - denies depression, anxiety, and irritability. Stating "I 'm doing pretty good". Did take Zoloft 100mg  during pregnancy - "antepartum depression". Stating "I don't handle pregnancy very well". Tapered off Zoloft after pregnancy and has done well without. Stable interest and motivation. Taking medications as prescribed.  Energy levels Active, does not have a regular exercise routine. Stay at home mom.  Enjoys some usual interests and activities. Married. Lives with husband of 7 years and children 4 and 1. Family local and supportive. Spending time with family. Appetite adequate. Weight stable - 170 pounds. Sleeps well most nights. Averages 8 hours. Focus and concentration difficulties without Vyvanse. Completing tasks. Managing aspects of household.  Denies SI or HI. Denies AH or VH.  Previous medication trials: Vyvanse  Visit Diagnosis:    ICD-10-CM   1. Attention deficit hyperactivity disorder (ADHD), predominantly inattentive type  F90.0     Past Psychiatric History: Denies psychiatric hospitalization.  Past Medical History:  Past Medical History:  Diagnosis Date  . Anxiety   . Depression    PP    Past Surgical History:  Procedure Laterality Date  . CESAREAN SECTION WITH BILATERAL TUBAL LIGATION N/A 12/19/2018   Procedure: CESAREAN SECTION WITH BILATERAL TUBAL LIGATION;  Surgeon: 12/21/2018, MD;  Location: [facility] LD ORS;  Service: Obstetrics;  Laterality: N/A;  Primary edc 9/2 NKDA Heather,  RNFA  . NO PAST SURGERIES      Family Psychiatric History: Denies any family history of mental illness.  Family History:  Family History  Problem Relation Age of Onset  . Thyroid disease Mother   . Hypertension Father   . Heart disease Paternal Aunt   . Thyroid disease Maternal Grandmother   . Hypertension Paternal Grandmother    . Cancer Paternal Grandfather        stomach    Social History:  Social History   Socioeconomic History  . Marital status: Married    Spouse name: Not on file  . Number of children: Not on file  . Years of education: Not on file  . Highest education level: Not on file  Occupational History  . Not on file  Tobacco Use  . Smoking status: Never Smoker  . Smokeless tobacco: Never Used  Vaping Use  . Vaping Use: Never used  Substance and Sexual Activity  . Alcohol use: No  . Drug use: No  . Sexual activity: Yes  Other Topics Concern  . Not on file  Social History Narrative  . Not on file   Social Determinants of Health   Financial Resource Strain:   . Difficulty of Paying Living Expenses:   Food Insecurity:   . Worried About 11/2 in the Last Year:   . Programme researcher, broadcasting/film/video in the Last Year:   Transportation Needs:   . Barista (Medical):   Freight forwarder Lack of Transportation (Non-Medical):   Physical Activity:   . Days of Exercise per Week:   . Minutes of Exercise per Session:   Stress:   . Feeling of Stress :   Social Connections:   . Frequency of Communication with Friends and Family:   . Frequency of Social Gatherings with Friends and Family:   . Attends Religious Services:   . Active Member of Clubs or Organizations:   . Attends [employer] Meetings:   .  Marital Status:     Allergies: No Known Allergies  Metabolic Disorder Labs: No results found for: HGBA1C, MPG No results found for: PROLACTIN No results found for: CHOL, TRIG, HDL, CHOLHDL, VLDL, LDLCALC No results found for: TSH  Therapeutic Level Labs: No results found for: LITHIUM No results found for: VALPROATE No components found for:  CBMZ  Current Medications: Current Outpatient Medications  Medication Sig Dispense Refill  . cyclobenzaprine (FLEXERIL) 10 MG tablet Take 10 mg by mouth at bedtime.    Marland Kitchen ibuprofen (ADVIL) 600 MG tablet Take 1 tablet (600 mg total) by mouth  every 6 (six) hours as needed for moderate pain. 30 tablet 0  . oxyCODONE-acetaminophen (PERCOCET/ROXICET) 5-325 MG tablet Take 1-2 tablets by mouth every 4 (four) hours as needed for moderate pain. 30 tablet 0  . prenatal vitamin w/FE, FA (PRENATAL 1 + 1) 27-1 MG TABS tablet Take 1 tablet by mouth daily.     . sertraline (ZOLOFT) 100 MG tablet Take 100 mg by mouth at bedtime.      No current facility-administered medications for this visit.    Medication Side Effects: none  Orders placed this visit:  No orders of the defined types were placed in this encounter.   Psychiatric Specialty Exam:  Review of Systems  unknown if currently breastfeeding.There is no height or weight on file to calculate BMI.  General Appearance: Casual, Neat and Well Groomed  Eye Contact:  Good  Speech:  Clear and Coherent and Normal Rate  Volume:  Normal  Mood:  Euthymic  Affect:  Appropriate and Congruent  Thought Process:  Coherent and Descriptions of Associations: Intact  Orientation:  Full (Time, Place, and Person)  Thought Content: Logical   Suicidal Thoughts:  No  Homicidal Thoughts:  No  Memory:  WNL  Judgement:  Good  Insight:  Good  Psychomotor Activity:  Normal  Concentration:  Concentration: Good  Recall:  Good  Fund of Knowledge: Good  Language: Good  Assets:  Communication Skills Desire for Improvement Financial Resources/Insurance Housing Intimacy Leisure Time Physical Health Resilience Social Support Talents/Skills Transportation Vocational/Educational  ADL's:  Intact  Cognition: WNL  Prognosis:  Good   Screenings:   Receiving Psychotherapy: No   Treatment Plan/Recommendations:   Plan:  PDMP reviewed  1. Restart Vyvanse 30mg  daily   RTC 6 months - will call in 3 months for next set of prescriptions.   Patient advised to contact office with any questions, adverse effects, or acute worsening in signs and symptoms.      , NP

## 2019-11-08 ENCOUNTER — Telehealth: Payer: Self-pay | Admitting: Adult Health

## 2019-11-08 ENCOUNTER — Other Ambulatory Visit: Payer: Self-pay

## 2019-11-08 DIAGNOSIS — F900 Attention-deficit hyperactivity disorder, predominantly inattentive type: Secondary | ICD-10-CM

## 2019-11-08 MED ORDER — LISDEXAMFETAMINE DIMESYLATE 30 MG PO CAPS: 30.0000 mg | ORAL_CAPSULE | Freq: Every day | ORAL | 0 refills | Status: DC

## 2019-11-08 NOTE — Telephone Encounter (Signed)
Pt called. Need PA for Vyvanse 30 mg

## 2019-11-08 NOTE — Telephone Encounter (Signed)
[facility] by Corie Chiquito, NP

## 2019-11-08 NOTE — Telephone Encounter (Signed)
Pt pharmacy called her and told her that vyvanse30mg  is out of stock. Pt would like another rx to be sent in at [facility]

## 2019-12-03 ENCOUNTER — Telehealth: Payer: Self-pay | Admitting: Adult Health

## 2019-12-03 ENCOUNTER — Other Ambulatory Visit: Payer: Self-pay

## 2019-12-03 DIAGNOSIS — F900 Attention-deficit hyperactivity disorder, predominantly inattentive type: Secondary | ICD-10-CM

## 2019-12-03 NOTE — Telephone Encounter (Signed)
Last refill 11/06/2019 Pended 2 Rx's for [facility] to sign and send

## 2019-12-03 NOTE — Telephone Encounter (Signed)
Pt would like her rx of vyvanse for August and September to [facility] [address]. Pt said that [facility] is still out of vyvanse.

## 2019-12-04 MED ORDER — LISDEXAMFETAMINE DIMESYLATE 30 MG PO CAPS: 30.0000 mg | ORAL_CAPSULE | Freq: Every day | ORAL | 0 refills | Status: DC

## 2020-02-06 ENCOUNTER — Encounter: Payer: Self-pay | Admitting: Adult Health

## 2020-02-06 ENCOUNTER — Other Ambulatory Visit: Payer: Self-pay

## 2020-02-06 ENCOUNTER — Ambulatory Visit (INDEPENDENT_AMBULATORY_CARE_PROVIDER_SITE_OTHER): Payer: 59 | Admitting: Adult Health

## 2020-02-06 VITALS — BP 127/81 | HR 96 | Wt 162.0 lb

## 2020-02-06 DIAGNOSIS — F411 Generalized anxiety disorder: Secondary | ICD-10-CM

## 2020-02-06 DIAGNOSIS — F900 Attention-deficit hyperactivity disorder, predominantly inattentive type: Secondary | ICD-10-CM

## 2020-02-06 MED ORDER — LISDEXAMFETAMINE DIMESYLATE 30 MG PO CAPS: 30.0000 mg | ORAL_CAPSULE | Freq: Every day | ORAL | 0 refills | Status: DC

## 2020-02-06 MED ORDER — ALPRAZOLAM 0.25 MG PO TABS: 0.2500 mg | ORAL_TABLET | Freq: Every evening | ORAL | 2 refills | Status: DC | PRN

## 2020-02-06 NOTE — Progress Notes (Signed)
Maria Yoder [id number] Dec 09, 1990 28 y.o.  Subjective:   Patient ID:  Maria Yoder is a 29 y.o. (DOB [birth year]) female.  Chief Complaint: No chief complaint on file.   HPI Maria Yoder presents to the office today for follow-up of ADHD and   Describes mood today as "ok". Pleasant. Mood symptoms - denies depression, anxiety, and irritability. Stating "I 'm doing pretty good". Some anxiety about son sleeping in a different room at night and will put him in bed with her. Feels like Vyvanse continues to work well for her. Daughter in pre-school. Son now age 82. Stable interest and motivation. Taking medications as prescribed.  Energy levels stable. Active, trying to exercise more.   Enjoys some usual interests and activities. Married. Lives with husband and 2 children. Family local and supportive. Spending time with family. Appetite adequate. Weight loss - 162 pounds. Sleeps well most nights. Averages 8 hours. Focus and concentration stable with Vyvanse. Completing tasks. Managing aspects of household. Stay at home mom.  Denies SI or HI. Denies AH or VH.  Previous medication trials: Vyvanse   Review of Systems:  Review of Systems  Musculoskeletal: Negative for gait problem.  Neurological: Negative for tremors.  Psychiatric/Behavioral:       Please refer to HPI    Medications: I have reviewed the patient's current medications.  Current Outpatient Medications  Medication Sig Dispense Refill  . ALPRAZolam (XANAX) 0.25 MG tablet Take 1 tablet (0.25 mg total) by mouth at bedtime as needed for anxiety. 30 tablet 2  . cyclobenzaprine (FLEXERIL) 10 MG tablet Take 10 mg by mouth at bedtime.    Marland Kitchen ibuprofen (ADVIL) 600 MG tablet Take 1 tablet (600 mg total) by mouth every 6 (six) hours as needed for moderate pain. 30 tablet 0  . lisdexamfetamine (VYVANSE) 30 MG capsule Take 1 capsule (30 mg total) by mouth daily. 30 capsule 0  . [START ON 03/05/2020]  lisdexamfetamine (VYVANSE) 30 MG capsule Take 1 capsule (30 mg total) by mouth daily. 30 capsule 0  . [START ON 04/02/2020] lisdexamfetamine (VYVANSE) 30 MG capsule Take 1 capsule (30 mg total) by mouth daily. 30 capsule 0  . oxyCODONE-acetaminophen (PERCOCET/ROXICET) 5-325 MG tablet Take 1-2 tablets by mouth every 4 (four) hours as needed for moderate pain. 30 tablet 0  . prenatal vitamin w/FE, FA (PRENATAL 1 + 1) 27-1 MG TABS tablet Take 1 tablet by mouth daily.      No current facility-administered medications for this visit.    Medication Side Effects: None  Allergies: No Known Allergies  Past Medical History:  Diagnosis Date  . Anxiety   . Depression    PP    Family History  Problem Relation Age of Onset  . Thyroid disease Mother   . Hypertension Father   . Heart disease Paternal Aunt   . Thyroid disease Maternal Grandmother   . Hypertension Paternal Grandmother   . Cancer Paternal Grandfather        stomach    Social History   Socioeconomic History  . Marital status: Married    Spouse name: Not on file  . Number of children: Not on file  . Years of education: Not on file  . Highest education level: Not on file  Occupational History  . Not on file  Tobacco Use  . Smoking status: Never Smoker  . Smokeless tobacco: Never Used  Vaping Use  . Vaping Use: Never used  Substance and Sexual Activity  .  Alcohol use: No  . Drug use: No  . Sexual activity: Yes  Other Topics Concern  . Not on file  Social History Narrative  . Not on file   Social Determinants of Health   Financial Resource Strain:   . Difficulty of Paying Living Expenses: Not on file  Food Insecurity:   . Worried About Programme researcher, broadcasting/film/video in the Last Year: Not on file  . Ran Out of Food in the Last Year: Not on file  Transportation Needs:   . Lack of Transportation (Medical): Not on file  . Lack of Transportation (Non-Medical): Not on file  Physical Activity:   . Days of Exercise per Week: Not  on file  . Minutes of Exercise per Session: Not on file  Stress:   . Feeling of Stress : Not on file  Social Connections:   . Frequency of Communication with Friends and Family: Not on file  . Frequency of Social Gatherings with Friends and Family: Not on file  . Attends Religious Services: Not on file  . Active Member of Clubs or Organizations: Not on file  . Attends Banker Meetings: Not on file  . Marital Status: Not on file  Intimate Partner Violence:   . Fear of Current or Ex-Partner: Not on file  . Emotionally Abused: Not on file  . Physically Abused: Not on file  . Sexually Abused: Not on file    Past Medical History, Surgical history, Social history, and Family history were reviewed and updated as appropriate.   Please see review of systems for further details on the patient's review from today.   Objective:   Physical Exam:  BP 127/81   Pulse 96   Physical Exam Constitutional:      General: She is not in acute distress. Musculoskeletal:        General: No deformity.  Neurological:     Mental Status: She is alert and oriented to person, place, and time.     Coordination: Coordination normal.  Psychiatric:        Attention and Perception: Attention and perception normal. She does not perceive auditory or visual hallucinations.        Mood and Affect: Mood normal. Mood is not anxious or depressed. Affect is not labile, blunt, angry or inappropriate.        Speech: Speech normal.        Behavior: Behavior normal.        Thought Content: Thought content normal. Thought content is not paranoid or delusional. Thought content does not include homicidal or suicidal ideation. Thought content does not include homicidal or suicidal plan.        Cognition and Memory: Cognition and memory normal.        Judgment: Judgment normal.     Comments: Insight intact     Lab Review:  No results found for: NA, K, CL, CO2, GLUCOSE, BUN, CREATININE, CALCIUM, PROT,  ALBUMIN, AST, ALT, ALKPHOS, BILITOT, GFRNONAA, GFRAA     Component Value Date/Time   WBC 8.9 12/20/2018 0600   RBC 3.34 (L) 12/20/2018 0600   HGB 10.5 (L) 12/20/2018 0600   HCT 31.2 (L) 12/20/2018 0600   PLT 144 (L) 12/20/2018 0600   MCV 93.4 12/20/2018 0600   MCH 31.4 12/20/2018 0600   MCHC 33.7 12/20/2018 0600   RDW 13.9 12/20/2018 0600    No results found for: POCLITH, LITHIUM   No results found for: PHENYTOIN, PHENOBARB, VALPROATE, CBMZ   .res  Assessment: Plan:    Diagnoses and all orders for this visit:  Attention deficit hyperactivity disorder (ADHD), predominantly inattentive type -     lisdexamfetamine (VYVANSE) 30 MG capsule; Take 1 capsule (30 mg total) by mouth daily. -     lisdexamfetamine (VYVANSE) 30 MG capsule; Take 1 capsule (30 mg total) by mouth daily. -     lisdexamfetamine (VYVANSE) 30 MG capsule; Take 1 capsule (30 mg total) by mouth daily.  Generalized anxiety disorder -     ALPRAZolam (XANAX) 0.25 MG tablet; Take 1 tablet (0.25 mg total) by mouth at bedtime as needed for anxiety.     Please see After Visit Summary for patient specific instructions.  Future Appointments  Date Time Provider Department Center  08/06/2020 10:00 AM Aramis Weil, Thereasa Solo, NP [facility] None    No orders of the defined types were placed in this encounter.   -------------------------------

## 2020-05-12 ENCOUNTER — Telehealth: Payer: Self-pay | Admitting: Adult Health

## 2020-05-12 ENCOUNTER — Other Ambulatory Visit: Payer: Self-pay | Admitting: Adult Health

## 2020-05-12 DIAGNOSIS — F900 Attention-deficit hyperactivity disorder, predominantly inattentive type: Secondary | ICD-10-CM

## 2020-05-12 MED ORDER — LISDEXAMFETAMINE DIMESYLATE 30 MG PO CAPS: 30.0000 mg | ORAL_CAPSULE | Freq: Every day | ORAL | 0 refills | Status: DC

## 2020-05-12 NOTE — Telephone Encounter (Signed)
Script sent  

## 2020-05-12 NOTE — Telephone Encounter (Signed)
Pt requesting Rx for Vyvanse 30 mg 1/d @ [facility] [address]

## 2020-06-08 ENCOUNTER — Telehealth: Payer: Self-pay | Admitting: Adult Health

## 2020-06-08 ENCOUNTER — Other Ambulatory Visit: Payer: Self-pay | Admitting: Adult Health

## 2020-06-08 DIAGNOSIS — F900 Attention-deficit hyperactivity disorder, predominantly inattentive type: Secondary | ICD-10-CM

## 2020-06-08 MED ORDER — LISDEXAMFETAMINE DIMESYLATE 30 MG PO CAPS: 30.0000 mg | ORAL_CAPSULE | Freq: Every day | ORAL | 0 refills | Status: DC

## 2020-06-08 NOTE — Telephone Encounter (Signed)
Patient called in today for refill on Vyvanse. [facility] [address]

## 2020-06-08 NOTE — Telephone Encounter (Signed)
Script sent  

## 2020-07-09 ENCOUNTER — Telehealth: Payer: Self-pay | Admitting: Adult Health

## 2020-07-09 NOTE — Telephone Encounter (Signed)
Patient called in needing request for Vyvanse 30mg . She has an appt 4/14. Pharmacy. Pt phone [phone]       [id number] [phone],  - [address]  [address], [place] [address] [place]

## 2020-07-10 ENCOUNTER — Other Ambulatory Visit: Payer: Self-pay

## 2020-07-10 DIAGNOSIS — F900 Attention-deficit hyperactivity disorder, predominantly inattentive type: Secondary | ICD-10-CM

## 2020-07-10 MED ORDER — LISDEXAMFETAMINE DIMESYLATE 30 MG PO CAPS: 30.0000 mg | ORAL_CAPSULE | Freq: Every day | ORAL | 0 refills | Status: DC

## 2020-07-10 NOTE — Telephone Encounter (Signed)
Script sent  

## 2020-07-10 NOTE — Telephone Encounter (Signed)
Last refill 06/10/20 Pended for Almira Coaster to review and [facility]

## 2020-08-06 ENCOUNTER — Encounter: Payer: Self-pay | Admitting: Adult Health

## 2020-08-06 ENCOUNTER — Ambulatory Visit (INDEPENDENT_AMBULATORY_CARE_PROVIDER_SITE_OTHER): Payer: 59 | Admitting: Adult Health

## 2020-08-06 ENCOUNTER — Other Ambulatory Visit: Payer: Self-pay

## 2020-08-06 DIAGNOSIS — F900 Attention-deficit hyperactivity disorder, predominantly inattentive type: Secondary | ICD-10-CM | POA: Diagnosis not present

## 2020-08-06 DIAGNOSIS — F411 Generalized anxiety disorder: Secondary | ICD-10-CM

## 2020-08-06 MED ORDER — LISDEXAMFETAMINE DIMESYLATE 30 MG PO CAPS: 30.0000 mg | ORAL_CAPSULE | Freq: Every day | ORAL | 0 refills | Status: DC

## 2020-08-06 MED ORDER — ALPRAZOLAM 0.25 MG PO TABS: 0.2500 mg | ORAL_TABLET | Freq: Every evening | ORAL | 2 refills | Status: DC | PRN

## 2020-08-06 NOTE — Progress Notes (Signed)
Maria Yoder [id number] 27-Aug-1990 29 y.o.  Subjective:   Patient ID:  Maria Yoder is a 30 y.o. (DOB [birth year]) female.  Chief Complaint: No chief complaint on file.   HPI Maria Yoder presents to the office today for follow-up of ADHD and   Describes mood today as "ok". Pleasant. Denies tearfulness. Mood symptoms - denies depression, anxiety, and irritability. Stating "I 'm doing alright". Feels like Vyvanse continues to work well for her. Stable interest and motivation. Taking medications as prescribed.  Energy levels stable. Active, trying to exercise more - going on walking.   Enjoys some usual interests and activities. Married. Lives with husband and 2 children - 1 and 5. Family local and supportive. Spending time with family. Appetite adequate. Weight loss - 162 pounds. Sleeps well most nights. Averages 8 hours.  Focus and concentration stable with Vyvanse. Completing tasks. Managing aspects of household. Stay at home mom.  Denies SI or HI.  Denies AH or VH.  Previous medication trials: Vyvanse  Review of Systems:  Review of Systems  Musculoskeletal: Negative for gait problem.  Neurological: Negative for tremors.  Psychiatric/Behavioral:       Please refer to HPI    Medications: I have reviewed the patient's current medications.  Current Outpatient Medications  Medication Sig Dispense Refill  . ALPRAZolam (XANAX) 0.25 MG tablet Take 1 tablet (0.25 mg total) by mouth at bedtime as needed for anxiety. 30 tablet 2  . cyclobenzaprine (FLEXERIL) 10 MG tablet Take 10 mg by mouth at bedtime.    Marland Kitchen ibuprofen (ADVIL) 600 MG tablet Take 1 tablet (600 mg total) by mouth every 6 (six) hours as needed for moderate pain. 30 tablet 0  . lisdexamfetamine (VYVANSE) 30 MG capsule Take 1 capsule (30 mg total) by mouth daily. 30 capsule 0  . [START ON 09/03/2020] lisdexamfetamine (VYVANSE) 30 MG capsule Take 1 capsule (30 mg total) by mouth daily. 30 capsule 0   . [START ON 10/01/2020] lisdexamfetamine (VYVANSE) 30 MG capsule Take 1 capsule (30 mg total) by mouth daily. 30 capsule 0  . oxyCODONE-acetaminophen (PERCOCET/ROXICET) 5-325 MG tablet Take 1-2 tablets by mouth every 4 (four) hours as needed for moderate pain. 30 tablet 0  . prenatal vitamin w/FE, FA (PRENATAL 1 + 1) 27-1 MG TABS tablet Take 1 tablet by mouth daily.      No current facility-administered medications for this visit.    Medication Side Effects: None  Allergies: No Known Allergies  Past Medical History:  Diagnosis Date  . Anxiety   . Depression    PP    Family History  Problem Relation Age of Onset  . Thyroid disease Mother   . Hypertension Father   . Heart disease Paternal Aunt   . Thyroid disease Maternal Grandmother   . Hypertension Paternal Grandmother   . Cancer Paternal Grandfather        stomach    Social History   Socioeconomic History  . Marital status: Married    Spouse name: Not on file  . Number of children: Not on file  . Years of education: Not on file  . Highest education level: Not on file  Occupational History  . Not on file  Tobacco Use  . Smoking status: Never Smoker  . Smokeless tobacco: Never Used  Vaping Use  . Vaping Use: Never used  Substance and Sexual Activity  . Alcohol use: No  . Drug use: No  . Sexual activity: Yes  Other  Topics Concern  . Not on file  Social History Narrative  . Not on file   Social Determinants of Health   Financial Resource Strain: Not on file  Food Insecurity: Not on file  Transportation Needs: Not on file  Physical Activity: Not on file  Stress: Not on file  Social Connections: Not on file  Intimate Partner Violence: Not on file    Past Medical History, Surgical history, Social history, and Family history were reviewed and updated as appropriate.   Please see review of systems for further details on the patient's review from today.   Objective:   Physical Exam:  There were no vitals  taken for this visit.  Physical Exam Constitutional:      General: She is not in acute distress. Musculoskeletal:        General: No deformity.  Neurological:     Mental Status: She is alert and oriented to person, place, and time.     Coordination: Coordination normal.  Psychiatric:        Attention and Perception: Attention and perception normal. She does not perceive auditory or visual hallucinations.        Mood and Affect: Mood normal. Mood is not anxious or depressed. Affect is not labile, blunt, angry or inappropriate.        Speech: Speech normal.        Behavior: Behavior normal.        Thought Content: Thought content normal. Thought content is not paranoid or delusional. Thought content does not include homicidal or suicidal ideation. Thought content does not include homicidal or suicidal plan.        Cognition and Memory: Cognition and memory normal.        Judgment: Judgment normal.     Comments: Insight intact     Lab Review:  No results found for: NA, K, CL, CO2, GLUCOSE, BUN, CREATININE, CALCIUM, PROT, ALBUMIN, AST, ALT, ALKPHOS, BILITOT, GFRNONAA, GFRAA     Component Value Date/Time   WBC 8.9 12/20/2018 0600   RBC 3.34 (L) 12/20/2018 0600   HGB 10.5 (L) 12/20/2018 0600   HCT 31.2 (L) 12/20/2018 0600   PLT 144 (L) 12/20/2018 0600   MCV 93.4 12/20/2018 0600   MCH 31.4 12/20/2018 0600   MCHC 33.7 12/20/2018 0600   RDW 13.9 12/20/2018 0600    No results found for: POCLITH, LITHIUM   No results found for: PHENYTOIN, PHENOBARB, VALPROATE, CBMZ   .res Assessment: Plan:     Plan:  PDMP reviewed  1. Vyvanse 30mg  daily 2. Xanax 0.25mg  at hs prn  126/89/84  RTC 6 months - will call in 3 months for next set of prescriptions.   Patient advised to contact office with any questions, adverse effects, or acute worsening in signs and symptoms.   Diagnoses and all orders for this visit:  Generalized anxiety disorder -     ALPRAZolam (XANAX) 0.25 MG tablet;  Take 1 tablet (0.25 mg total) by mouth at bedtime as needed for anxiety.  Attention deficit hyperactivity disorder (ADHD), predominantly inattentive type -     lisdexamfetamine (VYVANSE) 30 MG capsule; Take 1 capsule (30 mg total) by mouth daily. -     lisdexamfetamine (VYVANSE) 30 MG capsule; Take 1 capsule (30 mg total) by mouth daily. -     lisdexamfetamine (VYVANSE) 30 MG capsule; Take 1 capsule (30 mg total) by mouth daily.     Please see After Visit Summary for patient specific instructions.  No future appointments.  No orders of the defined types were placed in this encounter.   -------------------------------

## 2020-11-06 ENCOUNTER — Other Ambulatory Visit: Payer: Self-pay

## 2020-11-06 ENCOUNTER — Telehealth: Payer: Self-pay | Admitting: Adult Health

## 2020-11-06 DIAGNOSIS — F900 Attention-deficit hyperactivity disorder, predominantly inattentive type: Secondary | ICD-10-CM

## 2020-11-06 MED ORDER — LISDEXAMFETAMINE DIMESYLATE 30 MG PO CAPS: 30.0000 mg | ORAL_CAPSULE | Freq: Every day | ORAL | 0 refills | Status: DC

## 2020-11-06 NOTE — Telephone Encounter (Signed)
Maria Yoder called to request refill of her Vyvanse.  Appt 10/14.  [facility] [address], [place]

## 2020-11-06 NOTE — Telephone Encounter (Signed)
Pended.

## 2021-01-07 ENCOUNTER — Telehealth: Payer: Self-pay | Admitting: Adult Health

## 2021-01-07 NOTE — Telephone Encounter (Signed)
Pt requesting Rx for Vyvanse 30 mg to [facility] [address]

## 2021-01-07 NOTE — Telephone Encounter (Signed)
Looks like she may have 1 more Rx on file. Will check with pharmacy first

## 2021-01-08 ENCOUNTER — Other Ambulatory Visit: Payer: Self-pay

## 2021-01-08 ENCOUNTER — Telehealth: Payer: Self-pay | Admitting: Adult Health

## 2021-01-08 DIAGNOSIS — F900 Attention-deficit hyperactivity disorder, predominantly inattentive type: Secondary | ICD-10-CM

## 2021-01-08 NOTE — Telephone Encounter (Signed)
3 Rx's written on 7/15 but pharmacy only used 2 and report they don't have anymore. Pended for Almira Coaster to submit

## 2021-01-08 NOTE — Telephone Encounter (Signed)
[facility] Maria Yoder does not have any refills left on her Vyvanse 30 mg and they will need a new prescription sent.   [id number] [place], [place] - [address]  Phone:  [phone]  Fax:  [phone]

## 2021-01-11 MED ORDER — LISDEXAMFETAMINE DIMESYLATE 30 MG PO CAPS: 30.0000 mg | ORAL_CAPSULE | Freq: Every day | ORAL | 0 refills | Status: DC

## 2021-02-05 ENCOUNTER — Ambulatory Visit (INDEPENDENT_AMBULATORY_CARE_PROVIDER_SITE_OTHER): Payer: 59 | Admitting: Adult Health

## 2021-02-05 ENCOUNTER — Encounter: Payer: Self-pay | Admitting: Adult Health

## 2021-02-05 ENCOUNTER — Other Ambulatory Visit: Payer: Self-pay

## 2021-02-05 DIAGNOSIS — F411 Generalized anxiety disorder: Secondary | ICD-10-CM

## 2021-02-05 DIAGNOSIS — F900 Attention-deficit hyperactivity disorder, predominantly inattentive type: Secondary | ICD-10-CM | POA: Diagnosis not present

## 2021-02-05 MED ORDER — LISDEXAMFETAMINE DIMESYLATE 30 MG PO CAPS: 30.0000 mg | ORAL_CAPSULE | Freq: Every day | ORAL | 0 refills | Status: DC

## 2021-02-05 MED ORDER — ALPRAZOLAM 0.25 MG PO TABS: 0.2500 mg | ORAL_TABLET | Freq: Every evening | ORAL | 2 refills | Status: DC | PRN

## 2021-02-05 NOTE — Progress Notes (Signed)
Maria Yoder [id number] 21-Jul-1990 29 y.o.  Subjective:   Patient ID:  Maria Yoder is a 30 y.o. (DOB [birth year]) female.  Chief Complaint: No chief complaint on file.   HPI Maria Yoder presents to the office today for follow-up of ADHD and GAD  Describes mood today as "ok". Pleasant. Denies tearfulness. Mood symptoms - denies depression, anxiety, and irritability. Stating "I 'm doing alright". Feels like Vyvanse continues to work well for her. Stable interest and motivation. Taking medications as prescribed.  Energy levels stable. Active, trying to exercise more - going on walking.   Enjoys some usual interests and activities. Married. Lives with husband and 2 children - 1 and 5. Family local and supportive. Spending time with family. Appetite adequate. Weight loss - 162 pounds. Sleeps well most nights. Averages 8 hours.  Focus and concentration stable with Vyvanse. Completing tasks. Managing aspects of household. Stay at home mom.  Denies SI or HI.  Denies AH or VH.  Previous medication trials: Vyvanse  Review of Systems:  Review of Systems  Musculoskeletal:  Negative for gait problem.  Neurological:  Negative for tremors.  Psychiatric/Behavioral:         Please refer to HPI   Medications: I have reviewed the patient's current medications.  Current Outpatient Medications  Medication Sig Dispense Refill   ALPRAZolam (XANAX) 0.25 MG tablet Take 1 tablet (0.25 mg total) by mouth at bedtime as needed for anxiety. 30 tablet 2   cyclobenzaprine (FLEXERIL) 10 MG tablet Take 10 mg by mouth at bedtime.     ibuprofen (ADVIL) 600 MG tablet Take 1 tablet (600 mg total) by mouth every 6 (six) hours as needed for moderate pain. 30 tablet 0   lisdexamfetamine (VYVANSE) 30 MG capsule Take 1 capsule (30 mg total) by mouth daily. 30 capsule 0   [START ON 03/05/2021] lisdexamfetamine (VYVANSE) 30 MG capsule Take 1 capsule (30 mg total) by mouth daily. 30 capsule  0   [START ON 04/02/2021] lisdexamfetamine (VYVANSE) 30 MG capsule Take 1 capsule (30 mg total) by mouth daily. 30 capsule 0   oxyCODONE-acetaminophen (PERCOCET/ROXICET) 5-325 MG tablet Take 1-2 tablets by mouth every 4 (four) hours as needed for moderate pain. 30 tablet 0   prenatal vitamin w/FE, FA (PRENATAL 1 + 1) 27-1 MG TABS tablet Take 1 tablet by mouth daily.      No current facility-administered medications for this visit.    Medication Side Effects: None  Allergies: No Known Allergies  Past Medical History:  Diagnosis Date   Anxiety    Depression    PP    Past Medical History, Surgical history, Social history, and Family history were reviewed and updated as appropriate.   Please see review of systems for further details on the patient's review from today.   Objective:   Physical Exam:  There were no vitals taken for this visit.  Physical Exam Constitutional:      General: She is not in acute distress. Musculoskeletal:        General: No deformity.  Neurological:     Mental Status: She is alert and oriented to person, place, and time.     Coordination: Coordination normal.  Psychiatric:        Attention and Perception: Attention and perception normal. She does not perceive auditory or visual hallucinations.        Mood and Affect: Mood normal. Mood is not anxious or depressed. Affect is not labile, blunt, angry or  inappropriate.        Speech: Speech normal.        Behavior: Behavior normal.        Thought Content: Thought content normal. Thought content is not paranoid or delusional. Thought content does not include homicidal or suicidal ideation. Thought content does not include homicidal or suicidal plan.        Cognition and Memory: Cognition and memory normal.        Judgment: Judgment normal.     Comments: Insight intact    Lab Review:  No results found for: NA, K, CL, CO2, GLUCOSE, BUN, CREATININE, CALCIUM, PROT, ALBUMIN, AST, ALT, ALKPHOS, BILITOT,  GFRNONAA, GFRAA     Component Value Date/Time   WBC 8.9 12/20/2018 0600   RBC 3.34 (L) 12/20/2018 0600   HGB 10.5 (L) 12/20/2018 0600   HCT 31.2 (L) 12/20/2018 0600   PLT 144 (L) 12/20/2018 0600   MCV 93.4 12/20/2018 0600   MCH 31.4 12/20/2018 0600   MCHC 33.7 12/20/2018 0600   RDW 13.9 12/20/2018 0600    No results found for: POCLITH, LITHIUM   No results found for: PHENYTOIN, PHENOBARB, VALPROATE, CBMZ   .res Assessment: Plan:    Plan:  PDMP reviewed  1. Vyvanse 30mg  daily 2. Xanax 0.25mg  at hs prn  106/69 82  RTC 6 months - will call in 3 months for next set of prescriptions.   Patient advised to contact office with any questions, adverse effects, or acute worsening in signs and symptoms.  Discussed potential benefits, risks, and side effects of stimulants with patient to include increased heart rate, palpitations, insomnia, increased anxiety, increased irritability, or decreased appetite.  Instructed patient to contact office if experiencing any significant tolerability issues.   Discussed potential benefits, risk, and side effects of benzodiazepines to include potential risk of tolerance and dependence, as well as possible drowsiness.  Advised patient not to drive if experiencing drowsiness and to take lowest possible effective dose to minimize risk of dependence and tolerance.   Diagnoses and all orders for this visit:  Attention deficit hyperactivity disorder (ADHD), predominantly inattentive type -     lisdexamfetamine (VYVANSE) 30 MG capsule; Take 1 capsule (30 mg total) by mouth daily. -     lisdexamfetamine (VYVANSE) 30 MG capsule; Take 1 capsule (30 mg total) by mouth daily. -     lisdexamfetamine (VYVANSE) 30 MG capsule; Take 1 capsule (30 mg total) by mouth daily.  Generalized anxiety disorder -     ALPRAZolam (XANAX) 0.25 MG tablet; Take 1 tablet (0.25 mg total) by mouth at bedtime as needed for anxiety.    Please see After Visit Summary for patient  specific instructions.  No future appointments.  No orders of the defined types were placed in this encounter.   -------------------------------

## 2021-05-11 ENCOUNTER — Telehealth: Payer: Self-pay | Admitting: Adult Health

## 2021-05-11 ENCOUNTER — Other Ambulatory Visit: Payer: Self-pay

## 2021-05-11 DIAGNOSIS — F900 Attention-deficit hyperactivity disorder, predominantly inattentive type: Secondary | ICD-10-CM

## 2021-05-11 MED ORDER — LISDEXAMFETAMINE DIMESYLATE 30 MG PO CAPS: 30.0000 mg | ORAL_CAPSULE | Freq: Every day | ORAL | 0 refills | Status: DC

## 2021-05-11 NOTE — Telephone Encounter (Signed)
Pended.

## 2021-05-11 NOTE — Telephone Encounter (Signed)
Next visit is 08/06/21. Requesting refill on Vyvanse 30 mg called to:  [id number] [place], [place] - [address]  Phone:  [phone]  Fax:  [phone]

## 2021-05-12 NOTE — Telephone Encounter (Signed)
Addendum to attached message from 05/11/21. Valentino Saxon called and said that the pharmacy attached told her they only have prescriptions for February and March on the Vybrid but they do not have one for January and told Dani we need to fax this to them.   [id number] [place], [place] - [address]   Phone:  [phone]  Fax:  [phone]

## 2021-05-12 NOTE — Telephone Encounter (Signed)
3 scripts sent for yesterday and 1 is dated for 1/17

## 2021-05-13 NOTE — Telephone Encounter (Signed)
Maria Yoder at [facility] today that they had the script for Vyvanse 30mg  for Feb and March but not for January.  I tried to call them back to tell them we did send one for January but was on hold and never spoke to anyone.  You may need to re-transmit it.  Next appt 4/14

## 2021-05-14 ENCOUNTER — Other Ambulatory Visit: Payer: Self-pay

## 2021-05-14 DIAGNOSIS — F900 Attention-deficit hyperactivity disorder, predominantly inattentive type: Secondary | ICD-10-CM

## 2021-05-14 MED ORDER — LISDEXAMFETAMINE DIMESYLATE 30 MG PO CAPS: 30.0000 mg | ORAL_CAPSULE | Freq: Every day | ORAL | 0 refills | Status: DC

## 2021-05-18 ENCOUNTER — Telehealth: Payer: Self-pay

## 2021-05-18 NOTE — Telephone Encounter (Signed)
PA for vyvanse 30 mg capsules has been submitted and approved by [employer] via [employer].Effective dates 04/13/21-05/14/22

## 2021-05-18 NOTE — Telephone Encounter (Signed)
Pt informed PA approved  °

## 2021-05-18 NOTE — Telephone Encounter (Signed)
Maria Yoder Called to check status of PA for her Vyvanse.  She did say we have her correct insurance.  The pharmacy should have requested the PA.  If not, please do one because she is now out of her medication.

## 2021-08-06 ENCOUNTER — Ambulatory Visit: Payer: 59 | Admitting: Adult Health

## 2021-08-06 ENCOUNTER — Encounter: Payer: Self-pay | Admitting: Adult Health

## 2021-08-06 DIAGNOSIS — F900 Attention-deficit hyperactivity disorder, predominantly inattentive type: Secondary | ICD-10-CM

## 2021-08-06 DIAGNOSIS — F411 Generalized anxiety disorder: Secondary | ICD-10-CM

## 2021-08-06 MED ORDER — LISDEXAMFETAMINE DIMESYLATE 40 MG PO CAPS: 40.0000 mg | ORAL_CAPSULE | Freq: Every day | ORAL | 0 refills | Status: DC

## 2021-08-06 NOTE — Progress Notes (Signed)
Maria Yoder ?[id number] ?Mar 22, 1991 ?31 y.o. ? ?Subjective:  ? ?Patient ID:  Maria Yoder is a 31 y.o. (DOB 11/24/1990) female. ? ?Chief Complaint: No chief complaint on file. ? ? ?HPI ?Maria Yoder presents to the office today for follow-up of ADHD and GAD. ? ?Describes mood today as "ok". Pleasant. Denies tearfulness. Mood symptoms - denies depression, anxiety, and irritability. Stating "I'm doing pretty good". Feels like Vyvanse continues to work well for her. Stable interest and motivation. Taking medications as prescribed.  ?Energy levels stable. Active, has a regular exercise routine.   ?Enjoys some usual interests and activities. Married. Lives with husband and 2 children - 2 and 6. Family local and supportive. Spending time with family. ?Appetite adequate. Weight loss - 121 pounds. ?Sleeps well most nights. Averages 8 hours.  ?Focus and concentration stable with Vyvanse. Completing tasks. Managing aspects of household. Stay at home mom.  ?Denies SI or HI.  ?Denies AH or VH. ? ?Previous medication trials: Vyvanse ? ?  ?Review of Systems:  ?Review of Systems  ?Musculoskeletal:  Negative for gait problem.  ?Neurological:  Negative for tremors.  ?Psychiatric/Behavioral:    ?     Please refer to HPI  ? ?Medications: I have reviewed the patient's current medications. ? ?Current Outpatient Medications  ?Medication Sig Dispense Refill  ? ALPRAZolam (XANAX) 0.25 MG tablet Take 1 tablet (0.25 mg total) by mouth at bedtime as needed for anxiety. 30 tablet 2  ? cyclobenzaprine (FLEXERIL) 10 MG tablet Take 10 mg by mouth at bedtime.    ? ibuprofen (ADVIL) 600 MG tablet Take 1 tablet (600 mg total) by mouth every 6 (six) hours as needed for moderate pain. 30 tablet 0  ? lisdexamfetamine (VYVANSE) 30 MG capsule Take 1 capsule (30 mg total) by mouth daily. 30 capsule 0  ? lisdexamfetamine (VYVANSE) 30 MG capsule Take 1 capsule (30 mg total) by mouth daily. 30 capsule 0  ? lisdexamfetamine  (VYVANSE) 40 MG capsule Take 1 capsule (40 mg total) by mouth daily. 30 capsule 0  ? oxyCODONE-acetaminophen (PERCOCET/ROXICET) 5-325 MG tablet Take 1-2 tablets by mouth every 4 (four) hours as needed for moderate pain. 30 tablet 0  ? prenatal vitamin w/FE, FA (PRENATAL 1 + 1) 27-1 MG TABS tablet Take 1 tablet by mouth daily.     ? ?No current facility-administered medications for this visit.  ? ? ?Medication Side Effects: None ? ?Allergies: No Known Allergies ? ?Past Medical History:  ?Diagnosis Date  ? Anxiety   ? Depression   ? PP  ? ? ?Past Medical History, Surgical history, Social history, and Family history were reviewed and updated as appropriate.  ? ?Please see review of systems for further details on the patient's review from today.  ? ?Objective:  ? ?Physical Exam:  ?There were no vitals taken for this visit. ? ?Physical Exam ?Constitutional:   ?   General: She is not in acute distress. ?Musculoskeletal:     ?   General: No deformity.  ?Neurological:  ?   Mental Status: She is alert and oriented to person, place, and time.  ?   Coordination: Coordination normal.  ?Psychiatric:     ?   Attention and Perception: Attention and perception normal. She does not perceive auditory or visual hallucinations.     ?   Mood and Affect: Mood normal. Mood is not anxious or depressed. Affect is not labile, blunt, angry or inappropriate.     ?  Speech: Speech normal.     ?   Behavior: Behavior normal.     ?   Thought Content: Thought content normal. Thought content is not paranoid or delusional. Thought content does not include homicidal or suicidal ideation. Thought content does not include homicidal or suicidal plan.     ?   Cognition and Memory: Cognition and memory normal.     ?   Judgment: Judgment normal.  ?   Comments: Insight intact  ? ? ?Lab Review:  ?No results found for: NA, K, CL, CO2, GLUCOSE, BUN, CREATININE, CALCIUM, PROT, ALBUMIN, AST, ALT, ALKPHOS, BILITOT, GFRNONAA, GFRAA ? ?   ?Component Value Date/Time   ? WBC 8.9 12/20/2018 0600  ? RBC 3.34 (L) 12/20/2018 0600  ? HGB 10.5 (L) 12/20/2018 0600  ? HCT 31.2 (L) 12/20/2018 0600  ? PLT 144 (L) 12/20/2018 0600  ? MCV 93.4 12/20/2018 0600  ? MCH 31.4 12/20/2018 0600  ? MCHC 33.7 12/20/2018 0600  ? RDW 13.9 12/20/2018 0600  ? ? ?No results found for: POCLITH, LITHIUM  ? ?No results found for: PHENYTOIN, PHENOBARB, VALPROATE, CBMZ  ? ?.res ?Assessment: Plan:   ? ?Plan: ? ?PDMP reviewed ? ?1. Vyvanse 30mg  to 40mg  daily - will call in a month to get 3 refills or switch back to 30mg  if 40mg  not tolerated. ?2. Xanax 0.25mg  at hs prn ? ? ? ?123/77/65 ? ?RTC 6 months - will call in 3 months for next set of prescriptions.  ? ?Patient advised to contact office with any questions, adverse effects, or acute worsening in signs and symptoms. ? ?Discussed potential benefits, risks, and side effects of stimulants with patient to include increased heart rate, palpitations, insomnia, increased anxiety, increased irritability, or decreased appetite.  Instructed patient to contact office if experiencing any significant tolerability issues.  ? ?Discussed potential benefits, risk, and side effects of benzodiazepines to include potential risk of tolerance and dependence, as well as possible drowsiness.  Advised patient not to drive if experiencing drowsiness and to take lowest possible effective dose to minimize risk of dependence and tolerance.  ? ?Diagnoses and all orders for this visit: ? ?Attention deficit hyperactivity disorder (ADHD), predominantly inattentive type ?-     lisdexamfetamine (VYVANSE) 40 MG capsule; Take 1 capsule (40 mg total) by mouth daily. ? ?Generalized anxiety disorder ? ?  ? ?Please see After Visit Summary for patient specific instructions. ? ?No future appointments. ? ?No orders of the defined types were placed in this encounter. ? ? ?------------------------------- ?

## 2021-08-19 LAB — HM PAP SMEAR

## 2021-09-24 ENCOUNTER — Telehealth: Payer: Self-pay | Admitting: Adult Health

## 2021-09-24 NOTE — Telephone Encounter (Signed)
Just an FYI Report from Pt  Pt called reporting Vyvanse 40 mg worked well for her.  For up coming RF send 40 mg Vyvanse per Pt request. Advised Pt to notify us couple days before due.

## 2021-09-27 NOTE — Telephone Encounter (Signed)
Noted thanks °

## 2021-09-28 ENCOUNTER — Other Ambulatory Visit: Payer: Self-pay

## 2021-09-28 ENCOUNTER — Telehealth: Payer: Self-pay | Admitting: Adult Health

## 2021-09-28 DIAGNOSIS — F900 Attention-deficit hyperactivity disorder, predominantly inattentive type: Secondary | ICD-10-CM

## 2021-09-28 MED ORDER — LISDEXAMFETAMINE DIMESYLATE 30 MG PO CAPS: 30.0000 mg | ORAL_CAPSULE | Freq: Every day | ORAL | 0 refills | Status: DC

## 2021-09-28 NOTE — Telephone Encounter (Signed)
Pt called at 11am asking for a refill 3 months sent of 40 mg  [facility] [address]. Next appt in august

## 2021-09-28 NOTE — Telephone Encounter (Signed)
Pended.

## 2021-10-01 ENCOUNTER — Ambulatory Visit
Admission: EM | Admit: 2021-10-01 | Discharge: 2021-10-01 | Disposition: A | Discharge status: Home / Self Care | Payer: 59 | Attending: Emergency Medicine | Admitting: Emergency Medicine

## 2021-10-01 ENCOUNTER — Other Ambulatory Visit: Payer: Self-pay

## 2021-10-01 ENCOUNTER — Encounter: Payer: Self-pay | Admitting: Emergency Medicine

## 2021-10-01 ENCOUNTER — Telehealth: Payer: Self-pay | Admitting: Adult Health

## 2021-10-01 ENCOUNTER — Ambulatory Visit (INDEPENDENT_AMBULATORY_CARE_PROVIDER_SITE_OTHER): Payer: 59

## 2021-10-01 DIAGNOSIS — M79644 Pain in right finger(s): Secondary | ICD-10-CM

## 2021-10-01 DIAGNOSIS — S60121A Contusion of right index finger with damage to nail, initial encounter: Secondary | ICD-10-CM

## 2021-10-01 DIAGNOSIS — S6010XA Contusion of unspecified finger with damage to nail, initial encounter: Secondary | ICD-10-CM

## 2021-10-01 DIAGNOSIS — S67190A Crushing injury of right index finger, initial encounter: Secondary | ICD-10-CM

## 2021-10-01 MED ORDER — IBUPROFEN 600 MG PO TABS: 600.0000 mg | ORAL_TABLET | Freq: Four times a day (QID) | ORAL | 0 refills | Status: DC | PRN

## 2021-10-01 NOTE — ED Triage Notes (Signed)
Patient presents to Urgent Care with complaints of right finger injury  since two days ago when she slammed it in the door on accident, pt unable to move finger and hematoma noted to finger nail and swelling. Patient reports motrin and tylenol ans ice.

## 2021-10-01 NOTE — ED Notes (Signed)
Applied splint to finger with coban.

## 2021-10-01 NOTE — Telephone Encounter (Signed)
noted 

## 2021-10-01 NOTE — Discharge Instructions (Addendum)
Please call [facility] and try and get an appointment as soon as you can.  I have placed an urgent referral.  You can see any one of the hand specialists there.  They are all excellent.  Keep the splint on until you are seen by one of the specialist.  Take 600 mg of ibuprofen, 1000 mg of Tylenol together 3-4 times a day as needed for pain.  Go to the ER for the signs and symptoms we discussed

## 2021-10-01 NOTE — ED Provider Notes (Signed)
HPI  SUBJECTIVE:  Maria Yoder is a right-handed 31 y.o. female who presents with pain, limitation of motion of the right index finger after slamming the tip of it in a car door 2 days ago.  She has a large subungual hematoma.  She is unable to completely straighten out her finger at the PIP.Marland Kitchen Denies trauma to the PIP, proximal, middle phalanx.  States she smashed only the distal phalanx in the car door.  She reports numbness and tingling surrounding the DIP.  She took 800 mg of ibuprofen and 1000 mg of Tylenol in the first night.  The Tylenol helped.  She also tried trephinating her nail without success.  She has not taken any medications since then.  Symptoms are worse when she tries to straighten out her finger.  She has no past medical history.  LMP: 2 days ago.  Denies to possibility of being pregnant.  Past Medical History:  Diagnosis Date   Anxiety    Depression    PP    Past Surgical History:  Procedure Laterality Date   CESAREAN SECTION WITH BILATERAL TUBAL LIGATION N/A 12/19/2018   Procedure: CESAREAN SECTION WITH BILATERAL TUBAL LIGATION;  Surgeon: Zelphia Cairo, MD;  Location: [facility] ORS;  Service: Obstetrics;  Laterality: N/A;  Primary edc 9/2 NKDA Heather,  RNFA   NO PAST SURGERIES      Family History  Problem Relation Age of Onset   Thyroid disease Mother    Hypertension Father    Heart disease Paternal Aunt    Thyroid disease Maternal Grandmother    Hypertension Paternal Grandmother    Cancer Paternal Grandfather        stomach    Social History   Tobacco Use   Smoking status: Never   Smokeless tobacco: Never  Vaping Use   Vaping Use: Never used  Substance Use Topics   Alcohol use: No   Drug use: No    No current facility-administered medications for this encounter.  Current Outpatient Medications:    ibuprofen (ADVIL) 600 MG tablet, Take 1 tablet (600 mg total) by mouth every 6 (six) hours as needed., Disp: 30 tablet, Rfl: 0   ALPRAZolam  (XANAX) 0.25 MG tablet, Take 1 tablet (0.25 mg total) by mouth at bedtime as needed for anxiety., Disp: 30 tablet, Rfl: 2   ibuprofen (ADVIL) 600 MG tablet, Take 1 tablet (600 mg total) by mouth every 6 (six) hours as needed for moderate pain. (Patient not taking: Reported on 10/01/2021), Disp: 30 tablet, Rfl: 0   lisdexamfetamine (VYVANSE) 40 MG capsule, Take 1 capsule (40 mg total) by mouth daily., Disp: 30 capsule, Rfl: 0   prenatal vitamin w/FE, FA (PRENATAL 1 + 1) 27-1 MG TABS tablet, Take 1 tablet by mouth daily.  (Patient not taking: Reported on 10/01/2021), Disp: , Rfl:   No Known Allergies   ROS  As noted in HPI.   Physical Exam  BP 129/84 (BP Location: Right Arm)   Pulse 75   Temp 97.7 F (36.5 C)   Resp 18   LMP  (Within Days)   SpO2 100%   Constitutional: Well developed, well nourished, no acute distress Eyes:  EOMI, conjunctiva normal bilaterally HENT: Normocephalic, atraumatic,mucus membranes moist Respiratory: Normal inspiratory effort Cardiovascular: Normal rate GI: nondistended skin: No rash, skin intact Musculoskeletal: No tenderness over the MCP, proximal phalanx, PIP, middle phalanx, DIP.  Positive tenderness at the distal phalanx.  Positive for large subungual hematoma.  Cap refill less than  2 seconds.  Patient unable to straighten out finger at PIP secondary to pain.  She is able to extend at the DIP.  Two-point discrimination intact.  She is able to flex at the MCP, PIP, DIP.      Neurologic: Alert & oriented x 3, no focal neuro deficits Psychiatric: Speech and behavior appropriate   ED Course   Medications - No data to display  Orders Placed This Encounter  Procedures   DG Finger Index Right    Standing Status:   Standing    Number of Occurrences:   1    Order Specific Question:   Reason for Exam (SYMPTOM  OR DIAGNOSIS REQUIRED)    Answer:   injury   AMB referral to orthopedics    Referral Priority:   Urgent    Referral Type:   Consultation     Referred to Provider:   Zorita Pang.    Number of Visits Requested:   1   Apply finger splint static    Index finger    Standing Status:   Standing    Number of Occurrences:   1    Order Specific Question:   Laterality    Answer:   Right    No results found for this or any previous visit (from the past 24 hour(s)). DG Finger Index Right  Result Date: 10/01/2021 CLINICAL DATA:  Right index finger injury. Slammed in car door. EXAM: RIGHT INDEX FINGER 2+V COMPARISON:  None Available. FINDINGS: There is no evidence of fracture or dislocation. There is no evidence of arthropathy or other focal bone abnormality. Soft tissues are unremarkable. IMPRESSION: No acute fracture or dislocation within the RIGHT index finger. Electronically Signed   By: Roanna Banning M.D.   On: 10/01/2021 15:56    ED Clinical Impression  1. Crushing injury of right index finger, initial encounter   2. Subungual hematoma of digit of hand, initial encounter      ED Assessment/Plan  Reviewed imaging independently.  No fracture, dislocation see radiology report for full details.  will anesthetize finger and see if she can move it when she is pain-free.  If not, concern for tendon injury.  Procedure note: Had patient wash hands with soap and water.  Clean base of the finger with alcohol.  Did a digital block with 1.5 cc of 1% plain lidocaine with adequate anesthesia.  Patient tolerated procedure well.  Patient declined nail trephination.  Patient was still unable to fully extend at the PIP even with anesthesia.  I am concerned that she may have a tendon injury although I am not sure how that might of happened given the mechanism that she describes.  Placing urgent referral to [facility].  Placed in splint.  Tylenol/ibuprofen.  Discussed labs, imaging, MDM, treatment plan, and plan for follow-up with patient. Discussed sn/sx that should prompt return to the ED. patient agrees with plan.   Meds ordered this  encounter  Medications   ibuprofen (ADVIL) 600 MG tablet    Sig: Take 1 tablet (600 mg total) by mouth every 6 (six) hours as needed.    Dispense:  30 tablet    Refill:  0      *This clinic note was created using Scientist, clinical (histocompatibility and immunogenetics). Therefore, there may be occasional mistakes despite careful proofreading.  ?    Domenick Gong, MD 10/01/21 1730

## 2021-10-01 NOTE — Telephone Encounter (Signed)
Maria Yoder called and said that her Vyvanse was called in for 30 mg and not 40 mg. She has already picked up the 30 mg and she thought it was 40 mg. Can it be noted in the future when she needs a refill that if it 40 mg and not 30 mg.

## 2021-11-01 ENCOUNTER — Telehealth: Payer: Self-pay | Admitting: Adult Health

## 2021-11-01 ENCOUNTER — Other Ambulatory Visit: Payer: Self-pay

## 2021-11-01 DIAGNOSIS — F900 Attention-deficit hyperactivity disorder, predominantly inattentive type: Secondary | ICD-10-CM

## 2021-11-01 MED ORDER — LISDEXAMFETAMINE DIMESYLATE 40 MG PO CAPS: 40.0000 mg | ORAL_CAPSULE | Freq: Every day | ORAL | 0 refills | Status: DC

## 2021-11-01 NOTE — Telephone Encounter (Signed)
Next visit is 11/03/21. Requesting refill on her Vyvanse 40 mg called to:  [id number] [place], [place] - [address]  Phone:  [phone]  Fax:  [phone]

## 2021-11-01 NOTE — Telephone Encounter (Signed)
Pended.

## 2021-11-02 ENCOUNTER — Other Ambulatory Visit: Payer: Self-pay

## 2021-11-02 DIAGNOSIS — F900 Attention-deficit hyperactivity disorder, predominantly inattentive type: Secondary | ICD-10-CM

## 2021-11-02 MED ORDER — LISDEXAMFETAMINE DIMESYLATE 40 MG PO CAPS: 40.0000 mg | ORAL_CAPSULE | Freq: Every day | ORAL | 0 refills | Status: DC

## 2021-11-03 ENCOUNTER — Encounter: Payer: Self-pay | Admitting: Adult Health

## 2021-11-03 ENCOUNTER — Ambulatory Visit (INDEPENDENT_AMBULATORY_CARE_PROVIDER_SITE_OTHER): Payer: 59 | Admitting: Adult Health

## 2021-11-03 DIAGNOSIS — F900 Attention-deficit hyperactivity disorder, predominantly inattentive type: Secondary | ICD-10-CM

## 2021-11-03 DIAGNOSIS — F411 Generalized anxiety disorder: Secondary | ICD-10-CM

## 2021-11-03 MED ORDER — LISDEXAMFETAMINE DIMESYLATE 40 MG PO CAPS: 40.0000 mg | ORAL_CAPSULE | Freq: Every day | ORAL | 0 refills | Status: DC

## 2021-11-03 MED ORDER — LISDEXAMFETAMINE DIMESYLATE 40 MG PO CAPS: 40.0000 mg | ORAL_CAPSULE | ORAL | 0 refills | Status: DC

## 2021-11-03 MED ORDER — ALPRAZOLAM 0.25 MG PO TABS: 0.2500 mg | ORAL_TABLET | Freq: Every evening | ORAL | 2 refills | Status: DC | PRN

## 2021-11-03 NOTE — Progress Notes (Signed)
Maria Yoder [id number] 12/29/1990 30 y.o.  Subjective:   Patient ID:  Maria Yoder is a 31 y.o. (DOB 1991/03/01) female.  Chief Complaint: No chief complaint on file.   HPI Maria Yoder presents to the office today for follow-up of ADHD and GAD.  Describes mood today as "ok". Pleasant. Denies tearfulness. Mood symptoms - denies depression, anxiety, and irritability. Mood is consistent. Stating "I'm doing ok". Feels like Vyvanse at 40mg  continues to work well for her. Stable interest and motivation. Taking medications as prescribed.  Energy levels stable. Active, has a regular exercise routine.   Enjoys some usual interests and activities. Married. Lives with husband and 2 children. Family local and supportive. Spending time with family. Appetite adequate. Weight stable - 120 pounds. Sleeps well most nights. Averages 8 hours.  Focus and concentration stable. Completing tasks. Managing aspects of household. Stay at home mom.  Denies SI or HI.  Denies AH or VH.  Previous medication trials: Vyvanse    Flowsheet Row ED from 10/01/2021 in [facility]   C-SSRS RISK CATEGORY No Risk        Review of Systems:  Review of Systems  Musculoskeletal:  Negative for gait problem.  Neurological:  Negative for tremors.  Psychiatric/Behavioral:         Please refer to HPI    Medications: I have reviewed the patient's current medications.  Current Outpatient Medications  Medication Sig Dispense Refill   [START ON 12/29/2021] lisdexamfetamine (VYVANSE) 40 MG capsule Take 1 capsule (40 mg total) by mouth every morning. 30 capsule 0   lisdexamfetamine (VYVANSE) 40 MG capsule Take 1 capsule (40 mg total) by mouth every morning. 30 capsule 0   ALPRAZolam (XANAX) 0.25 MG tablet Take 1 tablet (0.25 mg total) by mouth at bedtime as needed for anxiety. 30 tablet 2   ibuprofen (ADVIL) 600 MG tablet Take 1 tablet (600 mg total) by mouth every  6 (six) hours as needed for moderate pain. (Patient not taking: Reported on 10/01/2021) 30 tablet 0   ibuprofen (ADVIL) 600 MG tablet Take 1 tablet (600 mg total) by mouth every 6 (six) hours as needed. 30 tablet 0   [START ON 12/01/2021] lisdexamfetamine (VYVANSE) 40 MG capsule Take 1 capsule (40 mg total) by mouth daily. 30 capsule 0   prenatal vitamin w/FE, FA (PRENATAL 1 + 1) 27-1 MG TABS tablet Take 1 tablet by mouth daily.  (Patient not taking: Reported on 10/01/2021)     No current facility-administered medications for this visit.    Medication Side Effects: None  Allergies: No Known Allergies  Past Medical History:  Diagnosis Date   Anxiety    Depression    PP    Past Medical History, Surgical history, Social history, and Family history were reviewed and updated as appropriate.   Please see review of systems for further details on the patient's review from today.   Objective:   Physical Exam:  There were no vitals taken for this visit.  Physical Exam Constitutional:      General: She is not in acute distress. Musculoskeletal:        General: No deformity.  Neurological:     Mental Status: She is alert and oriented to person, place, and time.     Coordination: Coordination normal.  Psychiatric:        Attention and Perception: Attention and perception normal. She does not perceive auditory or visual hallucinations.  Mood and Affect: Mood normal. Mood is not anxious or depressed. Affect is not labile, blunt, angry or inappropriate.        Speech: Speech normal.        Behavior: Behavior normal.        Thought Content: Thought content normal. Thought content is not paranoid or delusional. Thought content does not include homicidal or suicidal ideation. Thought content does not include homicidal or suicidal plan.        Cognition and Memory: Cognition and memory normal.        Judgment: Judgment normal.     Comments: Insight intact     Lab Review:  No results  found for: "NA", "K", "CL", "CO2", "GLUCOSE", "BUN", "CREATININE", "CALCIUM", "PROT", "ALBUMIN", "AST", "ALT", "ALKPHOS", "BILITOT", "GFRNONAA", "GFRAA"     Component Value Date/Time   WBC 8.9 12/20/2018 0600   RBC 3.34 (L) 12/20/2018 0600   HGB 10.5 (L) 12/20/2018 0600   HCT 31.2 (L) 12/20/2018 0600   PLT 144 (L) 12/20/2018 0600   MCV 93.4 12/20/2018 0600   MCH 31.4 12/20/2018 0600   MCHC 33.7 12/20/2018 0600   RDW 13.9 12/20/2018 0600    No results found for: "POCLITH", "LITHIUM"   No results found for: "PHENYTOIN", "PHENOBARB", "VALPROATE", "CBMZ"   .res Assessment: Plan:    Plan:  PDMP reviewed  1. Vyvanse 40mg  daily  2. Xanax 0.25mg  at hs prn  123/77/65  RTC 6 months - will call in 3 months for next set of prescriptions.    Time spent with patient was 15 minutes. Greater than 50% of face to face time with patient was spent on counseling and coordination of care.    Patient advised to contact office with any questions, adverse effects, or acute worsening in signs and symptoms.  Discussed potential benefits, risks, and side effects of stimulants with patient to include increased heart rate, palpitations, insomnia, increased anxiety, increased irritability, or decreased appetite.  Instructed patient to contact office if experiencing any significant tolerability issues.   Discussed potential benefits, risk, and side effects of benzodiazepines to include potential risk of tolerance and dependence, as well as possible drowsiness.  Advised patient not to drive if experiencing drowsiness and to take lowest possible effective dose to minimize risk of dependence and tolerance.   Diagnoses and all orders for this visit:  Generalized anxiety disorder -     ALPRAZolam (XANAX) 0.25 MG tablet; Take 1 tablet (0.25 mg total) by mouth at bedtime as needed for anxiety.  Attention deficit hyperactivity disorder (ADHD), predominantly inattentive type -     lisdexamfetamine (VYVANSE)  40 MG capsule; Take 1 capsule (40 mg total) by mouth daily. -     lisdexamfetamine (VYVANSE) 40 MG capsule; Take 1 capsule (40 mg total) by mouth every morning. -     lisdexamfetamine (VYVANSE) 40 MG capsule; Take 1 capsule (40 mg total) by mouth every morning.     Please see After Visit Summary for patient specific instructions.  No future appointments.  No orders of the defined types were placed in this encounter.   -------------------------------

## 2022-02-03 ENCOUNTER — Ambulatory Visit (INDEPENDENT_AMBULATORY_CARE_PROVIDER_SITE_OTHER): Payer: 59 | Admitting: Adult Health

## 2022-02-03 ENCOUNTER — Encounter: Payer: Self-pay | Admitting: Adult Health

## 2022-02-03 DIAGNOSIS — F411 Generalized anxiety disorder: Secondary | ICD-10-CM | POA: Diagnosis not present

## 2022-02-03 DIAGNOSIS — F900 Attention-deficit hyperactivity disorder, predominantly inattentive type: Secondary | ICD-10-CM

## 2022-02-03 MED ORDER — LISDEXAMFETAMINE DIMESYLATE 40 MG PO CAPS: 40.0000 mg | ORAL_CAPSULE | ORAL | 0 refills | Status: DC

## 2022-02-03 MED ORDER — LISDEXAMFETAMINE DIMESYLATE 40 MG PO CAPS: 40.0000 mg | ORAL_CAPSULE | Freq: Every day | ORAL | 0 refills | Status: DC

## 2022-02-03 MED ORDER — ALPRAZOLAM 0.25 MG PO TABS: 0.2500 mg | ORAL_TABLET | Freq: Every evening | ORAL | 2 refills | Status: DC | PRN

## 2022-02-03 NOTE — Progress Notes (Signed)
Arshi Duarte [id number] [birth year] 31 y.o.  Subjective:   Patient ID:  Maria Yoder is a 31 y.o. (DOB March 29, 1991) female.  Chief Complaint: No chief complaint on file.   HPI Maria Yoder presents to the office today for follow-up of ADHD and GAD.  Describes mood today as "ok". Pleasant. Denies tearfulness. Mood symptoms - denies depression, anxiety, and irritability. Mood is consistent. Stating "I'm doing alright". Feels like Vyvanse at 40mg  continues to work well for her. Taking Xanax as needed. Stable interest and motivation. Taking medications as prescribed.  Energy levels stable. Active, has a regular exercise routine.   Enjoys some usual interests and activities. Married. Lives with husband and 2 children - 6 and 3. Family local and supportive. Spending time with family. Appetite adequate. Weight stable - 120 pounds. Sleeps well most nights. Averages 8 hours.  Focus and concentration stable. Completing tasks. Managing aspects of household. Stay at home mom.  Denies SI or HI.  Denies AH or VH. Denies self harm. Denies substance use.  Previous medication trials: Vyvanse    Flowsheet Row ED from 10/01/2021 in [facility] [place] [facility]        Review of Systems:  Review of Systems  Musculoskeletal:  Negative for gait problem.  Neurological:  Negative for tremors.  Psychiatric/Behavioral:         Please refer to HPI    Medications: I have reviewed the patient's current medications.  Current Outpatient Medications  Medication Sig Dispense Refill   ALPRAZolam (XANAX) 0.25 MG tablet Take 1 tablet (0.25 mg total) by mouth at bedtime as needed for anxiety. 30 tablet 2   ibuprofen (ADVIL) 600 MG tablet Take 1 tablet (600 mg total) by mouth every 6 (six) hours as needed for moderate pain. (Patient not taking: Reported on 10/01/2021) 30 tablet 0   ibuprofen (ADVIL) 600 MG tablet Take 1 tablet (600 mg  total) by mouth every 6 (six) hours as needed. 30 tablet 0   lisdexamfetamine (VYVANSE) 40 MG capsule Take 1 capsule (40 mg total) by mouth daily. 30 capsule 0   [START ON 03/03/2022] lisdexamfetamine (VYVANSE) 40 MG capsule Take 1 capsule (40 mg total) by mouth every morning. 30 capsule 0   [START ON 03/31/2022] lisdexamfetamine (VYVANSE) 40 MG capsule Take 1 capsule (40 mg total) by mouth every morning. 30 capsule 0   prenatal vitamin w/FE, FA (PRENATAL 1 + 1) 27-1 MG TABS tablet Take 1 tablet by mouth daily.  (Patient not taking: Reported on 10/01/2021)     No current facility-administered medications for this visit.    Medication Yoder Effects: None  Allergies: No Known Allergies  Past Medical History:  Diagnosis Date   Anxiety    Depression    PP    Past Medical History, Surgical history, Social history, and Family history were reviewed and updated as appropriate.   Please see review of systems for further details on the patient's review from today.   Objective:   Physical Exam:  There were no vitals taken for this visit.  Physical Exam Constitutional:      General: She is not in acute distress. Musculoskeletal:        General: No deformity.  Neurological:     Mental Status: She is alert and oriented to person, place, and time.     Coordination: Coordination normal.  Psychiatric:        Attention and Perception:  Attention and perception normal. She does not perceive auditory or visual hallucinations.        Mood and Affect: Mood normal. Mood is not anxious or depressed. Affect is not labile, blunt, angry or inappropriate.        Speech: Speech normal.        Behavior: Behavior normal.        Thought Content: Thought content normal. Thought content is not paranoid or delusional. Thought content does not include homicidal or suicidal ideation. Thought content does not include homicidal or suicidal plan.        Cognition and Memory: Cognition and memory normal.         Judgment: Judgment normal.     Comments: Insight intact     Lab Review:  No results found for: "NA", "K", "CL", "CO2", "GLUCOSE", "BUN", "CREATININE", "CALCIUM", "PROT", "ALBUMIN", "AST", "ALT", "ALKPHOS", "BILITOT", "GFRNONAA", "GFRAA"     Component Value Date/Time   WBC 8.9 12/20/2018 0600   RBC 3.34 (L) 12/20/2018 0600   HGB 10.5 (L) 12/20/2018 0600   HCT 31.2 (L) 12/20/2018 0600   PLT 144 (L) 12/20/2018 0600   MCV 93.4 12/20/2018 0600   MCH 31.4 12/20/2018 0600   MCHC 33.7 12/20/2018 0600   RDW 13.9 12/20/2018 0600    No results found for: "POCLITH", "LITHIUM"   No results found for: "PHENYTOIN", "PHENOBARB", "VALPROATE", "CBMZ"   .res Assessment: Plan:    Plan:  PDMP reviewed  1. Vyvanse 40mg  daily  2. Xanax 0.25mg  at hs prn  Monitor BP between visits while taking stimulant medication.   RTC 6 months - will call in 3 months for next set of prescriptions.   Time spent with patient was 15 minutes. Greater than 50% of face to face time with patient was spent on counseling and coordination of care.    Patient advised to contact office with any questions, adverse effects, or acute worsening in signs and symptoms.  Discussed potential benefits, risks, and Yoder effects of stimulants with patient to include increased heart rate, palpitations, insomnia, increased anxiety, increased irritability, or decreased appetite.  Instructed patient to contact office if experiencing any significant tolerability issues.   Discussed potential benefits, risk, and Yoder effects of benzodiazepines to include potential risk of tolerance and dependence, as well as possible drowsiness.  Advised patient not to drive if experiencing drowsiness and to take lowest possible effective dose to minimize risk of dependence and tolerance.   Diagnoses and all orders for this visit:  Attention deficit hyperactivity disorder (ADHD), predominantly inattentive type -     lisdexamfetamine (VYVANSE) 40 MG  capsule; Take 1 capsule (40 mg total) by mouth daily. -     lisdexamfetamine (VYVANSE) 40 MG capsule; Take 1 capsule (40 mg total) by mouth every morning. -     lisdexamfetamine (VYVANSE) 40 MG capsule; Take 1 capsule (40 mg total) by mouth every morning.  Generalized anxiety disorder -     ALPRAZolam (XANAX) 0.25 MG tablet; Take 1 tablet (0.25 mg total) by mouth at bedtime as needed for anxiety.     Please see After Visit Summary for patient specific instructions.  No future appointments.  No orders of the defined types were placed in this encounter.   -------------------------------

## 2022-03-14 ENCOUNTER — Ambulatory Visit (INDEPENDENT_AMBULATORY_CARE_PROVIDER_SITE_OTHER): Payer: Managed Care, Other (non HMO) | Admitting: Nurse Practitioner

## 2022-03-14 ENCOUNTER — Encounter: Payer: Self-pay | Admitting: Nurse Practitioner

## 2022-03-14 VITALS — BP 133/76 | HR 89 | Temp 98.6°F | Ht 61.5 in | Wt 118.2 lb

## 2022-03-14 DIAGNOSIS — R599 Enlarged lymph nodes, unspecified: Secondary | ICD-10-CM

## 2022-03-14 DIAGNOSIS — F988 Other specified behavioral and emotional disorders with onset usually occurring in childhood and adolescence: Secondary | ICD-10-CM | POA: Diagnosis not present

## 2022-03-14 DIAGNOSIS — F419 Anxiety disorder, unspecified: Secondary | ICD-10-CM

## 2022-03-14 NOTE — Patient Instructions (Signed)
It was great to see you!  You have a swollen lymph node below your ear. You can use warm compresses on this. If it doesn't go away in the 4 weeks, let me know.   Let's follow-up in 1 year, sooner if you have concerns.  If a referral was placed today, you will be contacted for an appointment. Please note that routine referrals can sometimes take up to 3-4 weeks to process. Please call our office if you haven't heard anything after this time frame.  Take care,  Rodman Pickle, NP

## 2022-03-14 NOTE — Assessment & Plan Note (Signed)
Chronic, stable. Follows with psychiatry at [facility]. Continue collaboration and recommendations from psychiatry.

## 2022-03-14 NOTE — Assessment & Plan Note (Signed)
Chronic, stable. She follows with psychiatry at [facility]. Her PHQ 9 is a 1 and her GAD 7 is a 0. Continue collaboration and recommendations from psychiatry.

## 2022-03-14 NOTE — Progress Notes (Signed)
New Patient Visit  BP 133/76 (BP Location: Right Arm, Patient Position: Sitting, Cuff Size: Normal)   Pulse 89   Temp 98.6 F (37 C) (Temporal)   Ht 5' 1.5" (1.562 m)   Wt 118 lb 3.2 oz (53.6 kg)   LMP 02/19/2022   SpO2 96%   BMI 21.97 kg/m    Subjective:    Patient ID: Maria Yoder, female    DOB: 04-Aug-1990, 31 y.o.   MRN: [id number]  CC: Chief Complaint  Patient presents with   Establish Care    Est care pt c/o knot behind right ear x1 week not painful. Not fasting.    HPI: Maria Yoder is a 31 y.o. female presents for new patient visit to establish care.  Introduced to Publishing rights manager role and practice setting.  All questions answered.  Discussed provider/patient relationship and expectations.  She noticed a knot behind her ear last week. She states it was tender at first to touch. She feels like it has gone down some. The week before this, she had a sore throat and stuffy nose. Then last week, she was having diarrhea for a few days. She denies fevers.   She has a history of anxiety and ADHD. She goes to a psychiatrist at [facility] and is currently taking vyvanse 40mg  daily and xanax 0.25mg  as needed for anxiety. She states that her symptoms are well controlled.      03/14/2022   10:41 AM 03/14/2022    9:56 AM  Depression screen PHQ 2/9  Decreased Interest 0 0  Down, Depressed, Hopeless 0 0  PHQ - 2 Score 0 0  Altered sleeping 0   Tired, decreased energy 1   Change in appetite 0   Feeling bad or failure about yourself  0   Trouble concentrating 0   Moving slowly or fidgety/restless 0   Suicidal thoughts 0   PHQ-9 Score 1   Difficult doing work/chores Not difficult at all       03/14/2022   10:42 AM  GAD 7 : Generalized Anxiety Score  Nervous, Anxious, on Edge 0  Control/stop worrying 0  Worry too much - different things 0  Trouble relaxing 0  Restless 0  Easily annoyed or irritable 0  Afraid - awful might happen 0  Total GAD 7  Score 0    Past Medical History:  Diagnosis Date   ADHD    Anxiety    Depression    PP    Past Surgical History:  Procedure Laterality Date   CESAREAN SECTION WITH BILATERAL TUBAL LIGATION N/A 12/19/2018   Procedure: CESAREAN SECTION WITH BILATERAL TUBAL LIGATION;  Surgeon: 12/21/2018, MD;  Location: [facility] LD ORS;  Service: Obstetrics;  Laterality: N/A;  Primary edc 9/2 NKDA Heather,  RNFA    Family History  Problem Relation Age of Onset   Thyroid disease Mother    Hypertension Father    Heart disease Paternal Aunt    Thyroid disease Maternal Grandmother    Hypertension Paternal Grandmother    Cancer Paternal Grandfather        stomach     Social History   Tobacco Use   Smoking status: Never   Smokeless tobacco: Never  Vaping Use   Vaping Use: Never used  Substance Use Topics   Alcohol use: No   Drug use: No    Current Outpatient Medications on File Prior to Visit  Medication Sig Dispense Refill   ALPRAZolam (XANAX) 0.25 MG  tablet Take 1 tablet (0.25 mg total) by mouth at bedtime as needed for anxiety. 30 tablet 2   ibuprofen (ADVIL) 600 MG tablet Take 1 tablet (600 mg total) by mouth every 6 (six) hours as needed for moderate pain. 30 tablet 0   [START ON 03/31/2022] lisdexamfetamine (VYVANSE) 40 MG capsule Take 1 capsule (40 mg total) by mouth every morning. 30 capsule 0   No current facility-administered medications on file prior to visit.     Review of Systems  Constitutional: Negative.   HENT: Negative.    Eyes: Negative.   Respiratory: Negative.    Cardiovascular: Negative.   Gastrointestinal: Negative.   Genitourinary: Negative.   Musculoskeletal: Negative.   Skin:        Bump behind right ear  Neurological: Negative.   Psychiatric/Behavioral: Negative.        Objective:    BP 133/76 (BP Location: Right Arm, Patient Position: Sitting, Cuff Size: Normal)   Pulse 89   Temp 98.6 F (37 C) (Temporal)   Ht 5' 1.5" (1.562 m)   Wt 118 lb 3.2  oz (53.6 kg)   LMP 02/19/2022   SpO2 96%   BMI 21.97 kg/m   Wt Readings from Last 3 Encounters:  03/14/22 118 lb 3.2 oz (53.6 kg)  12/19/18 180 lb (81.6 kg)  12/06/18 180 lb (81.6 kg)    BP Readings from Last 3 Encounters:  03/14/22 133/76  10/01/21 129/84  12/21/18 106/68    Physical Exam Vitals and nursing note reviewed.  Constitutional:      General: She is not in acute distress.    Appearance: Normal appearance.  HENT:     Head: Normocephalic and atraumatic.     Comments: Post auricular lymph node palpated behind right ear    Right Ear: Tympanic membrane, ear canal and external ear normal.     Left Ear: Tympanic membrane, ear canal and external ear normal.  Eyes:     Conjunctiva/sclera: Conjunctivae normal.  Cardiovascular:     Rate and Rhythm: Normal rate and regular rhythm.     Pulses: Normal pulses.     Heart sounds: Normal heart sounds.  Pulmonary:     Effort: Pulmonary effort is normal.     Breath sounds: Normal breath sounds.  Abdominal:     Palpations: Abdomen is soft.     Tenderness: There is no abdominal tenderness.  Musculoskeletal:        General: Normal range of motion.     Cervical back: Normal range of motion and neck supple.     Right lower leg: No edema.     Left lower leg: No edema.  Lymphadenopathy:     Cervical: No cervical adenopathy.  Skin:    General: Skin is warm and dry.  Neurological:     General: No focal deficit present.     Mental Status: She is alert and oriented to person, place, and time.     Cranial Nerves: No cranial nerve deficit.     Coordination: Coordination normal.     Gait: Gait normal.  Psychiatric:        Mood and Affect: Mood normal.        Behavior: Behavior normal.        Thought Content: Thought content normal.        Judgment: Judgment normal.        Assessment & Plan:   Problem List Items Addressed This Visit       Other  ADD (attention deficit disorder)    Chronic, stable. Follows with psychiatry  at [facility]. Continue collaboration and recommendations from psychiatry.       Anxiety    Chronic, stable. She follows with psychiatry at [facility]. Her PHQ 9 is a 1 and her GAD 7 is a 0. Continue collaboration and recommendations from psychiatry.       Other Visit Diagnoses     Swollen lymph nodes    -  Primary   Right post auricular. Gettting smaller, most likely from recent URI. She can use warm compresses. F/U if not improving in 4 weeks. Reassurance given        Follow up plan: Return in about 1 year (around 03/15/2023) for CPE.

## 2022-05-31 ENCOUNTER — Telehealth: Payer: Self-pay | Admitting: Adult Health

## 2022-05-31 ENCOUNTER — Other Ambulatory Visit: Payer: Self-pay

## 2022-05-31 DIAGNOSIS — F411 Generalized anxiety disorder: Secondary | ICD-10-CM

## 2022-05-31 DIAGNOSIS — F900 Attention-deficit hyperactivity disorder, predominantly inattentive type: Secondary | ICD-10-CM

## 2022-05-31 MED ORDER — ALPRAZOLAM 0.25 MG PO TABS: 0.2500 mg | ORAL_TABLET | Freq: Every evening | ORAL | 2 refills | Status: DC | PRN

## 2022-05-31 MED ORDER — LISDEXAMFETAMINE DIMESYLATE 40 MG PO CAPS: 40.0000 mg | ORAL_CAPSULE | ORAL | 0 refills | Status: DC

## 2022-05-31 NOTE — Telephone Encounter (Signed)
Pended.

## 2022-05-31 NOTE — Telephone Encounter (Signed)
Pt called and said that she needs a refill on her xanax and 40 mg of brand vyvanse. [facility] [address]

## 2022-06-07 ENCOUNTER — Telehealth: Payer: Self-pay | Admitting: Adult Health

## 2022-06-07 ENCOUNTER — Telehealth: Payer: Self-pay

## 2022-06-07 NOTE — Telephone Encounter (Addendum)
Prior Authorization Vyvanse 40 mg #[id number] Request came thru [facility] as brand. [facility]  Approved:  Coverage Start Date:05/08/2022;Coverage End Date:06/07/2023

## 2022-06-07 NOTE — Telephone Encounter (Signed)
Patient called stating that she needs a PA for Vyvanse. It was supposed to be sent over yesterday as she has been out of medicine for 3 days. Ph: [phone] Appt 4/12

## 2022-06-07 NOTE — Telephone Encounter (Signed)
PA approved. Sent MyChart message.

## 2022-06-30 ENCOUNTER — Ambulatory Visit: Payer: Managed Care, Other (non HMO) | Admitting: Nurse Practitioner

## 2022-06-30 ENCOUNTER — Encounter: Payer: Self-pay | Admitting: Nurse Practitioner

## 2022-06-30 VITALS — BP 110/76 | HR 54 | Temp 98.6°F | Ht 61.5 in | Wt 116.4 lb

## 2022-06-30 DIAGNOSIS — H1032 Unspecified acute conjunctivitis, left eye: Secondary | ICD-10-CM | POA: Diagnosis not present

## 2022-06-30 MED ORDER — CIPROFLOXACIN HCL 0.3 % OP SOLN
2.0000 [drp] | OPHTHALMIC | 0 refills | Status: DC
Start: 2022-06-30 — End: 2023-09-11

## 2022-06-30 NOTE — Progress Notes (Signed)
   Acute Office Visit  Subjective:     Patient ID: Maria Yoder, female    DOB: 02/25/1991, 32 y.o.   MRN: [id number]  Chief Complaint  Patient presents with   Eye Problem    bilateral eye irritation started on 2/23   HPI   Patient is in today for eye redness, itching, irritation that started a week and a half ago in her right eye.  She had her daughter's leftover trimethoprim eyedrops and used them in both eyes.  Her symptoms went away on Sunday and she stopped using them and then they came back.  Currently her left eye is the one that is red and has drainage.  She endorses itching, irritation.  She denies changes in vision, URI symptoms, fevers.  She does not wear contacts.  ROS See pertinent positives and negatives per HPI.      Objective:    BP 110/76 (BP Location: Right Arm)   Pulse (!) 54   Temp 98.6 F (37 C)   Ht 5' 1.5" (1.562 m)   Wt 116 lb 6.4 oz (52.8 kg)   LMP 04/08/2022 (Approximate) Comment: No menstral cycle for past 2 months  SpO2 95%   BMI 21.64 kg/m    Physical Exam Vitals and nursing note reviewed.  Constitutional:      General: She is not in acute distress.    Appearance: Normal appearance.  HENT:     Head: Normocephalic.  Eyes:     Conjunctiva/sclera:     Right eye: Right conjunctiva is not injected. No exudate.    Left eye: Left conjunctiva is injected. Exudate present.  Pulmonary:     Effort: Pulmonary effort is normal.  Musculoskeletal:     Cervical back: Normal range of motion.  Skin:    General: Skin is warm.  Neurological:     General: No focal deficit present.     Mental Status: She is alert and oriented to person, place, and time.  Psychiatric:        Mood and Affect: Mood normal.        Behavior: Behavior normal.        Thought Content: Thought content normal.        Judgment: Judgment normal.      Assessment & Plan:   Problem List Items Addressed This Visit   None Visit Diagnoses     Acute bacterial  conjunctivitis of left eye    -  Primary   She already tried trimethoprim eye drops. Will have her start cipro 2 eye drops bilaterally q2 hours while awake x2 days, then q4 hours x5 days.       Meds ordered this encounter  Medications   ciprofloxacin (CILOXAN) 0.3 % ophthalmic solution    Sig: Place 2 drops into both eyes every 2 (two) hours. Administer 1 drop, every 2 hours, while awake, for 2 days. Then 1 drop, every 4 hours, while awake, for the next 5 days.    Dispense:  10 mL    Refill:  0    Return if symptoms worsen or fail to improve.  Charyl Dancer, NP

## 2022-06-30 NOTE — Patient Instructions (Signed)
It was great to see you!  Start cipro 2 eye drops in both eyes every 2 hours while awake for 2 days, then do it every 4 hours while awake for 5 days.   You can use cool compresses to help with itching. You can also try claritin or zyrtec once a day.   Let's follow-up if your symptoms worsen or don't improve.   Take care,  Vance Peper, NP

## 2022-07-04 ENCOUNTER — Other Ambulatory Visit: Payer: Self-pay | Admitting: Adult Health

## 2022-07-04 ENCOUNTER — Telehealth: Payer: Self-pay | Admitting: Adult Health

## 2022-07-04 DIAGNOSIS — F900 Attention-deficit hyperactivity disorder, predominantly inattentive type: Secondary | ICD-10-CM

## 2022-07-04 MED ORDER — LISDEXAMFETAMINE DIMESYLATE 40 MG PO CAPS: 40.0000 mg | ORAL_CAPSULE | ORAL | 0 refills | Status: DC

## 2022-07-04 NOTE — Telephone Encounter (Signed)
Pt called asking for a refill on her vyvanse 40 mg. [facility] [address]. They do have it in stock

## 2022-08-03 ENCOUNTER — Telehealth: Payer: Self-pay | Admitting: Adult Health

## 2022-08-03 ENCOUNTER — Other Ambulatory Visit: Payer: Self-pay | Admitting: Adult Health

## 2022-08-03 DIAGNOSIS — F900 Attention-deficit hyperactivity disorder, predominantly inattentive type: Secondary | ICD-10-CM

## 2022-08-03 MED ORDER — LISDEXAMFETAMINE DIMESYLATE 40 MG PO CAPS: 40.0000 mg | ORAL_CAPSULE | ORAL | 0 refills | Status: DC

## 2022-08-03 NOTE — Telephone Encounter (Signed)
Pt called at 2:45p.  She would like refill of Vyvanse 40mg  sent to   [phone] [place], [place] - [address], [place] [address] Phone: [phone]  Fax: [phone]  It is available there.  Next appt 4/12

## 2022-08-03 NOTE — Telephone Encounter (Signed)
Script sent  

## 2022-08-05 ENCOUNTER — Ambulatory Visit (INDEPENDENT_AMBULATORY_CARE_PROVIDER_SITE_OTHER): Payer: Self-pay | Admitting: Adult Health

## 2022-08-05 DIAGNOSIS — Z0389 Encounter for observation for other suspected diseases and conditions ruled out: Secondary | ICD-10-CM

## 2022-08-05 NOTE — Progress Notes (Signed)
Patient no show appointment. ? ?

## 2022-09-02 ENCOUNTER — Other Ambulatory Visit: Payer: Self-pay | Admitting: Adult Health

## 2022-09-02 ENCOUNTER — Telehealth: Payer: Self-pay | Admitting: Adult Health

## 2022-09-02 DIAGNOSIS — F900 Attention-deficit hyperactivity disorder, predominantly inattentive type: Secondary | ICD-10-CM

## 2022-09-02 MED ORDER — LISDEXAMFETAMINE DIMESYLATE 40 MG PO CAPS: 40.0000 mg | ORAL_CAPSULE | ORAL | 0 refills | Status: DC

## 2022-09-02 NOTE — Telephone Encounter (Signed)
Pt has an appt 56/20.She needs a refill on the brand name of vyvanse 40 mg. [facility] [address]. They do have it in stock

## 2022-09-02 NOTE — Telephone Encounter (Signed)
Script sent  

## 2022-09-14 ENCOUNTER — Ambulatory Visit (INDEPENDENT_AMBULATORY_CARE_PROVIDER_SITE_OTHER): Payer: 59 | Admitting: Adult Health

## 2022-09-14 ENCOUNTER — Telehealth: Payer: Self-pay | Admitting: Adult Health

## 2022-09-14 ENCOUNTER — Other Ambulatory Visit: Payer: Self-pay

## 2022-09-14 ENCOUNTER — Encounter: Payer: Self-pay | Admitting: Adult Health

## 2022-09-14 DIAGNOSIS — F900 Attention-deficit hyperactivity disorder, predominantly inattentive type: Secondary | ICD-10-CM

## 2022-09-14 DIAGNOSIS — F411 Generalized anxiety disorder: Secondary | ICD-10-CM

## 2022-09-14 MED ORDER — ALPRAZOLAM 0.25 MG PO TABS: 0.2500 mg | ORAL_TABLET | Freq: Every evening | ORAL | 2 refills | Status: DC | PRN

## 2022-09-14 MED ORDER — LISDEXAMFETAMINE DIMESYLATE 40 MG PO CAPS: 40.0000 mg | ORAL_CAPSULE | ORAL | 0 refills | Status: DC

## 2022-09-14 NOTE — Telephone Encounter (Signed)
[facility] called and said that her prescriptions were sent to the wrong pharmacy below.  [facility] - [place], [place] - [address]   Phone: [phone]  Fax: [phone]   Needs to go to:  [id number] [place], [place] - [address]   Phone: [phone]  Fax: [phone]

## 2022-09-14 NOTE — Progress Notes (Addendum)
Maria Yoder [id number] 1990/07/08 31 y.o.  Subjective:   Patient ID:  Maria Yoder is a 32 y.o. (DOB 29-Jun-1990) female.  Chief Complaint: No chief complaint on file.   HPI Maria Yoder presents to the office today for follow-up of ADHD and GAD.  Describes mood today as "ok". Pleasant. Denies tearfulness. Mood symptoms - denies depression, anxiety, and irritability. Mood is consistent. Stating "I'm doing pretty good". Feels like Vyvanse at 40mg  continues to work well for her. Taking Xanax as needed. Stable interest and motivation. Taking medications as prescribed.  Energy levels stable. Active, has a regular exercise routine.   Enjoys some usual interests and activities. Married. Lives with husband and 2 children - 7 and 3. Family local and supportive. Spending time with family. Appetite adequate. Weight stable - 120 pounds. Sleeps well most nights. Averages 8 hours.  Focus and concentration stable. Completing tasks. Managing aspects of household. Stay at home mom.  Denies SI or HI.  Denies AH or VH. Denies self harm. Denies substance use.  Previous medication trials: Vyvanse   GAD-7    Flowsheet Row Office Visit from 03/14/2022 in [facility]  Total GAD-7 Score 0      PHQ2-9    Flowsheet Row Office Visit from 03/14/2022 in [facility] Total Score 0  PHQ-9 Total Score 1      Flowsheet Row ED from 10/01/2021 in [facility])  C-SSRS RISK CATEGORY No Risk        Review of Systems:  Review of Systems  Musculoskeletal:  Negative for gait problem.  Neurological:  Negative for tremors.  Psychiatric/Behavioral:         Please refer to HPI    Medications: I have reviewed the patient's current medications.  Current Outpatient Medications  Medication Sig Dispense Refill   [START ON 10/12/2022] lisdexamfetamine  (VYVANSE) 40 MG capsule Take 1 capsule (40 mg total) by mouth every morning. 30 capsule 0   [START ON 11/09/2022] lisdexamfetamine (VYVANSE) 40 MG capsule Take 1 capsule (40 mg total) by mouth every morning. 30 capsule 0   ALPRAZolam (XANAX) 0.25 MG tablet Take 1 tablet (0.25 mg total) by mouth at bedtime as needed for anxiety. 30 tablet 2   ciprofloxacin (CILOXAN) 0.3 % ophthalmic solution Place 2 drops into both eyes every 2 (two) hours. Administer 1 drop, every 2 hours, while awake, for 2 days. Then 1 drop, every 4 hours, while awake, for the next 5 days. 10 mL 0   ibuprofen (ADVIL) 600 MG tablet Take 1 tablet (600 mg total) by mouth every 6 (six) hours as needed for moderate pain. (Patient not taking: Reported on 06/30/2022) 30 tablet 0   lisdexamfetamine (VYVANSE) 40 MG capsule Take 1 capsule (40 mg total) by mouth every morning. 30 capsule 0   UNABLE TO FIND Med Name: POLYMYXIN eye drops     No current facility-administered medications for this visit.    Medication Side Effects: None  Allergies: No Known Allergies  Past Medical History:  Diagnosis Date   ADHD    Anxiety    Depression    PP    Past Medical History, Surgical history, Social history, and Family history were reviewed and updated as appropriate.   Please see review of systems for further details on the patient's review from today.   Objective:   Physical Exam:  There were  no vitals taken for this visit.  Physical Exam Constitutional:      General: She is not in acute distress. Musculoskeletal:        General: No deformity.  Neurological:     Mental Status: She is alert and oriented to person, place, and time.     Coordination: Coordination normal.  Psychiatric:        Attention and Perception: Attention and perception normal. She does not perceive auditory or visual hallucinations.        Mood and Affect: Mood normal. Mood is not anxious or depressed. Affect is not labile, blunt, angry or inappropriate.         Speech: Speech normal.        Behavior: Behavior normal.        Thought Content: Thought content normal. Thought content is not paranoid or delusional. Thought content does not include homicidal or suicidal ideation. Thought content does not include homicidal or suicidal plan.        Cognition and Memory: Cognition and memory normal.        Judgment: Judgment normal.     Comments: Insight intact     Lab Review:  No results found for: "NA", "K", "CL", "CO2", "GLUCOSE", "BUN", "CREATININE", "CALCIUM", "PROT", "ALBUMIN", "AST", "ALT", "ALKPHOS", "BILITOT", "GFRNONAA", "GFRAA"     Component Value Date/Time   WBC 8.9 12/20/2018 0600   RBC 3.34 (L) 12/20/2018 0600   HGB 10.5 (L) 12/20/2018 0600   HCT 31.2 (L) 12/20/2018 0600   PLT 144 (L) 12/20/2018 0600   MCV 93.4 12/20/2018 0600   MCH 31.4 12/20/2018 0600   MCHC 33.7 12/20/2018 0600   RDW 13.9 12/20/2018 0600    No results found for: "POCLITH", "LITHIUM"   No results found for: "PHENYTOIN", "PHENOBARB", "VALPROATE", "CBMZ"   .res Assessment: Plan:    Plan:  PDMP reviewed  1. Vyvanse 40mg  daily Patient no show appointment.  2. Xanax 0.25mg  at hs prn  Monitor BP between visits while taking stimulant medication.   BP 120/79/78  RTC 6 months - will call in 3 months for next set of prescriptions.   Time spent with patient was 15 minutes. Greater than 50% of face to face time with patient was spent on counseling and coordination of care.    Patient advised to contact office with any questions, adverse effects, or acute worsening in signs and symptoms.  Discussed potential benefits, risks, and side effects of stimulants with patient to include increased heart rate, palpitations, insomnia, increased anxiety, increased irritability, or decreased appetite.  Instructed patient to contact office if experiencing any significant tolerability issues.   Discussed potential benefits, risk, and side effects of benzodiazepines to include  potential risk of tolerance and dependence, as well as possible drowsiness.  Advised patient not to drive if experiencing drowsiness and to take lowest possible effective dose to minimize risk of dependence and tolerance.   Diagnoses and all orders for this visit:  Attention deficit hyperactivity disorder (ADHD), predominantly inattentive type -     lisdexamfetamine (VYVANSE) 40 MG capsule; Take 1 capsule (40 mg total) by mouth every morning.  Generalized anxiety disorder -     ALPRAZolam (XANAX) 0.25 MG tablet; Take 1 tablet (0.25 mg total) by mouth at bedtime as needed for anxiety. -     lisdexamfetamine (VYVANSE) 40 MG capsule; Take 1 capsule (40 mg total) by mouth every morning. -     lisdexamfetamine (VYVANSE) 40 MG capsule; Take 1 capsule (40 mg total) by  mouth every morning.     Please see After Visit Summary for patient specific instructions.  No future appointments.   No orders of the defined types were placed in this encounter.   -------------------------------

## 2022-09-14 NOTE — Telephone Encounter (Signed)
Canceled at [facility], [facility]

## 2022-12-30 ENCOUNTER — Telehealth: Payer: Self-pay | Admitting: Adult Health

## 2022-12-30 ENCOUNTER — Other Ambulatory Visit: Payer: Self-pay

## 2022-12-30 DIAGNOSIS — F900 Attention-deficit hyperactivity disorder, predominantly inattentive type: Secondary | ICD-10-CM

## 2022-12-30 DIAGNOSIS — F411 Generalized anxiety disorder: Secondary | ICD-10-CM

## 2022-12-30 MED ORDER — LISDEXAMFETAMINE DIMESYLATE 40 MG PO CAPS: 40.0000 mg | ORAL_CAPSULE | ORAL | 0 refills | Status: DC

## 2022-12-30 NOTE — Telephone Encounter (Signed)
Pended.

## 2022-12-30 NOTE — Telephone Encounter (Signed)
Pt called at 2:02p requesting refill of Vyvanse 40mg  to  [id number], [place] - [address], [place] [address] Phone: [phone]  Fax: [phone]    Next appt 11/22

## 2023-03-17 ENCOUNTER — Ambulatory Visit: Payer: 59 | Admitting: Adult Health

## 2023-03-17 ENCOUNTER — Encounter: Payer: Self-pay | Admitting: Adult Health

## 2023-03-17 DIAGNOSIS — F900 Attention-deficit hyperactivity disorder, predominantly inattentive type: Secondary | ICD-10-CM

## 2023-03-17 DIAGNOSIS — F411 Generalized anxiety disorder: Secondary | ICD-10-CM

## 2023-03-17 MED ORDER — LISDEXAMFETAMINE DIMESYLATE 40 MG PO CAPS: 40.0000 mg | ORAL_CAPSULE | ORAL | 0 refills | Status: DC

## 2023-03-17 NOTE — Progress Notes (Signed)
Maria Yoder [id number] April 15, 1991 32 y.o.  Subjective:   Patient ID:  Maria Yoder is a 32 y.o. (DOB 1991/04/23) female.  Chief Complaint: No chief complaint on file.   HPI Maria Yoder presents to the office today for follow-up of ADHD and GAD.  Describes mood today as "ok". Pleasant. Denies tearfulness. Mood symptoms - denies depression, anxiety, and irritability. Denies panic attacks. Denies worry, rumination, and over thinking. Mood is consistent. Stating "I feel like I'm doing good". Feels like Vyvanse at 40mg  continues to work well for her. Taking Xanax as needed. Stable interest and motivation. Taking medications as prescribed.  Energy levels stable. Active, has a regular exercise routine.   Enjoys some usual interests and activities. Married. Lives with husband and 2 children - 8 and 4. Family local and supportive. Spending time with family. Appetite adequate. Weight stable - 120 pounds. Sleeps well most nights. Averages 8 hours.  Focus and concentration stable. Completing tasks. Managing aspects of household. Stay at home mom.  Denies SI or HI.  Denies AH or VH. Denies self harm. Denies substance use.  Previous medication trials: Vyvanse    GAD-7    Flowsheet Row Office Visit from 03/14/2022 in [facility]  Total GAD-7 Score 0      PHQ2-9    Flowsheet Row Office Visit from 03/14/2022 in [facility] Total Score 0  PHQ-9 Total Score 1      Flowsheet Row ED from 10/01/2021 in [facility])  C-SSRS RISK CATEGORY No Risk        Review of Systems:  Review of Systems  Musculoskeletal:  Negative for gait problem.  Neurological:  Negative for tremors.  Psychiatric/Behavioral:         Please refer to HPI    Medications: I have reviewed the patient's current medications.  Current Outpatient Medications   Medication Sig Dispense Refill   ALPRAZolam (XANAX) 0.25 MG tablet Take 1 tablet (0.25 mg total) by mouth at bedtime as needed for anxiety. 30 tablet 2   ciprofloxacin (CILOXAN) 0.3 % ophthalmic solution Place 2 drops into both eyes every 2 (two) hours. Administer 1 drop, every 2 hours, while awake, for 2 days. Then 1 drop, every 4 hours, while awake, for the next 5 days. 10 mL 0   ibuprofen (ADVIL) 600 MG tablet Take 1 tablet (600 mg total) by mouth every 6 (six) hours as needed for moderate pain. (Patient not taking: Reported on 06/30/2022) 30 tablet 0   lisdexamfetamine (VYVANSE) 40 MG capsule Take 1 capsule (40 mg total) by mouth every morning. 30 capsule 0   [START ON 04/14/2023] lisdexamfetamine (VYVANSE) 40 MG capsule Take 1 capsule (40 mg total) by mouth every morning. 30 capsule 0   [START ON 05/12/2023] lisdexamfetamine (VYVANSE) 40 MG capsule Take 1 capsule (40 mg total) by mouth every morning. 30 capsule 0   UNABLE TO FIND Med Name: POLYMYXIN eye drops     No current facility-administered medications for this visit.    Medication Side Effects: None  Allergies: No Known Allergies  Past Medical History:  Diagnosis Date   ADHD    Anxiety    Depression    PP    Past Medical History, Surgical history, Social history, and Family history were reviewed and updated as appropriate.   Please see review of systems for further details on the patient's review  from today.   Objective:   Physical Exam:  There were no vitals taken for this visit.  Physical Exam Constitutional:      General: She is not in acute distress. Musculoskeletal:        General: No deformity.  Neurological:     Mental Status: She is alert and oriented to person, place, and time.     Coordination: Coordination normal.  Psychiatric:        Attention and Perception: Attention and perception normal. She does not perceive auditory or visual hallucinations.        Mood and Affect: Affect is not labile, blunt,  angry or inappropriate.        Speech: Speech normal.        Behavior: Behavior normal.        Thought Content: Thought content normal. Thought content is not paranoid or delusional. Thought content does not include homicidal or suicidal ideation. Thought content does not include homicidal or suicidal plan.        Cognition and Memory: Cognition and memory normal.        Judgment: Judgment normal.     Comments: Insight intact     Lab Review:  No results found for: "NA", "K", "CL", "CO2", "GLUCOSE", "BUN", "CREATININE", "CALCIUM", "PROT", "ALBUMIN", "AST", "ALT", "ALKPHOS", "BILITOT", "GFRNONAA", "GFRAA"     Component Value Date/Time   WBC 8.9 12/20/2018 0600   RBC 3.34 (L) 12/20/2018 0600   HGB 10.5 (L) 12/20/2018 0600   HCT 31.2 (L) 12/20/2018 0600   PLT 144 (L) 12/20/2018 0600   MCV 93.4 12/20/2018 0600   MCH 31.4 12/20/2018 0600   MCHC 33.7 12/20/2018 0600   RDW 13.9 12/20/2018 0600    No results found for: "POCLITH", "LITHIUM"   No results found for: "PHENYTOIN", "PHENOBARB", "VALPROATE", "CBMZ"   .res Assessment: Plan:    Plan:  PDMP reviewed  1. Vyvanse 40mg  daily    2. Xanax 0.25mg  at hs prn - none needed this visit  Monitor BP between visits while taking stimulant medication.   RTC 6 months - will call in 3 months for next set of prescriptions.   Time spent with patient was 15 minutes. Greater than 50% of face to face time with patient was spent on counseling and coordination of care.    Patient advised to contact office with any questions, adverse effects, or acute worsening in signs and symptoms.  Discussed potential benefits, risks, and side effects of stimulants with patient to include increased heart rate, palpitations, insomnia, increased anxiety, increased irritability, or decreased appetite.  Instructed patient to contact office if experiencing any significant tolerability issues.   Discussed potential benefits, risk, and side effects of  benzodiazepines to include potential risk of tolerance and dependence, as well as possible drowsiness.  Advised patient not to drive if experiencing drowsiness and to take lowest possible effective dose to minimize risk of dependence and tolerance.   Diagnoses and all orders for this visit:  Generalized anxiety disorder -     lisdexamfetamine (VYVANSE) 40 MG capsule; Take 1 capsule (40 mg total) by mouth every morning. -     lisdexamfetamine (VYVANSE) 40 MG capsule; Take 1 capsule (40 mg total) by mouth every morning.  Attention deficit hyperactivity disorder (ADHD), predominantly inattentive type -     lisdexamfetamine (VYVANSE) 40 MG capsule; Take 1 capsule (40 mg total) by mouth every morning.     Please see After Visit Summary for patient specific instructions.  No future appointments.  No orders of the defined types were placed in this encounter.   -------------------------------

## 2023-04-10 ENCOUNTER — Encounter: Payer: Self-pay | Admitting: Nurse Practitioner

## 2023-04-10 ENCOUNTER — Ambulatory Visit (INDEPENDENT_AMBULATORY_CARE_PROVIDER_SITE_OTHER): Payer: Managed Care, Other (non HMO) | Admitting: Nurse Practitioner

## 2023-04-10 VITALS — BP 102/78 | HR 83 | Temp 97.6°F | Ht 61.5 in | Wt 119.4 lb

## 2023-04-10 DIAGNOSIS — M26629 Arthralgia of temporomandibular joint, unspecified side: Secondary | ICD-10-CM | POA: Diagnosis not present

## 2023-04-10 MED ORDER — CYCLOBENZAPRINE HCL 10 MG PO TABS: 10.0000 mg | ORAL_TABLET | Freq: Three times a day (TID) | ORAL | 0 refills | Status: DC | PRN

## 2023-04-10 NOTE — Patient Instructions (Signed)
It was great to see you!  Start ibuprofen or aleve 3 times a day (400mg )  Start flexeril at bedtime (muscle relaxer)  Try to eat soft foods  You can also try heat or ice  Let's follow-up if your symptoms worsen or don't improve.   Take care,  Rodman Pickle, NP

## 2023-04-10 NOTE — Progress Notes (Signed)
Acute Office Visit  Subjective:     Patient ID: Maria Yoder, female    DOB: 1990/10/11, 32 y.o.   MRN: [id number]  Chief Complaint  Patient presents with   Jaw Pain    Left side jaw pain for 2 weeks, hurts to open mouth    HPI Patient is in today for left jaw pain for 2 weeks.   Discussed the use of AI scribe software for clinical note transcription with the patient, who gave verbal consent to proceed.  History of Present Illness   The patient, with a history of regular dental check-ups and no known history of dental issues, presents with left-sided jaw pain and difficulty opening her mouth for the past couple of weeks. The pain is localized and increases when she tries to open her mouth wide. She has to apply pressure with her finger to help open her mouth. She denies any fever or trouble chewing food. She has not tried any specific treatments for her current symptoms, but she does use a heating pad at night. She denies any recent increase in stress. She still has all her wisdom teeth, which have never caused her any problems before.      ROS See pertinent positives and negatives per HPI.     Objective:    BP 102/78 (BP Location: Right Arm)   Pulse 83   Temp 97.6 F (36.4 C)   Ht 5' 1.5" (1.562 m)   Wt 119 lb 6.4 oz (54.2 kg)   LMP 03/31/2023 (Exact Date)   SpO2 100%   Breastfeeding No   BMI 22.20 kg/m    Physical Exam Vitals and nursing note reviewed.  Constitutional:      General: She is not in acute distress.    Appearance: Normal appearance.  HENT:     Head: Normocephalic.     Right Ear: Tympanic membrane, ear canal and external ear normal.     Left Ear: Tympanic membrane, ear canal and external ear normal.     Mouth/Throat:     Comments: Tenderness at left TMJ, clicking when open mouth Eyes:     Conjunctiva/sclera: Conjunctivae normal.  Cardiovascular:     Rate and Rhythm: Normal rate and regular rhythm.     Pulses: Normal pulses.     Heart  sounds: Normal heart sounds.  Pulmonary:     Effort: Pulmonary effort is normal.     Breath sounds: Normal breath sounds.  Musculoskeletal:     Cervical back: Normal range of motion.  Skin:    General: Skin is warm.  Neurological:     General: No focal deficit present.     Mental Status: She is alert and oriented to person, place, and time.  Psychiatric:        Mood and Affect: Mood normal.        Behavior: Behavior normal.        Thought Content: Thought content normal.        Judgment: Judgment normal.       Assessment & Plan:      Temporomandibular Joint Dysfunction (TMJ)   She presents with left-sided jaw pain and difficulty opening her mouth for the past two weeks, without fever or difficulty chewing. There is no known history of teeth grinding, but there is a family history of TMJ. We will start ibuprofen or Aleve for pain and inflammation and Flexeril 10mg  (cyclobenzaprine) at bedtime for muscle relaxation. We recommend a soft diet, heat or ice  application, and range of motion exercises. She is advised to follow up with a dentist for further evaluation and possible mouth guard fitting. If symptoms persist or worsen, she should follow up with primary care.       Meds ordered this encounter  Medications   cyclobenzaprine (FLEXERIL) 10 MG tablet    Sig: Take 1 tablet (10 mg total) by mouth 3 (three) times daily as needed for muscle spasms.    Dispense:  30 tablet    Refill:  0    Return if symptoms worsen or fail to improve.  Gerre Scull, NP

## 2023-06-28 ENCOUNTER — Telehealth: Payer: Self-pay | Admitting: Adult Health

## 2023-06-28 ENCOUNTER — Other Ambulatory Visit: Payer: Self-pay

## 2023-06-28 DIAGNOSIS — F900 Attention-deficit hyperactivity disorder, predominantly inattentive type: Secondary | ICD-10-CM

## 2023-06-28 NOTE — Telephone Encounter (Signed)
 Please RF Vyvanse 40mg  for pt. Pt asks that we put she can get generic or brand. Has next Appt in May. [facility]: [id number] [place], [place] - [address]

## 2023-06-28 NOTE — Telephone Encounter (Signed)
 [facility] with [facility] to [facility]  brand if generic not available.

## 2023-06-29 MED ORDER — LISDEXAMFETAMINE DIMESYLATE 40 MG PO CAPS: 40.0000 mg | ORAL_CAPSULE | ORAL | 0 refills | Status: DC

## 2023-07-31 ENCOUNTER — Other Ambulatory Visit: Payer: Self-pay

## 2023-07-31 ENCOUNTER — Telehealth: Payer: Self-pay | Admitting: Adult Health

## 2023-07-31 DIAGNOSIS — F900 Attention-deficit hyperactivity disorder, predominantly inattentive type: Secondary | ICD-10-CM

## 2023-07-31 DIAGNOSIS — F411 Generalized anxiety disorder: Secondary | ICD-10-CM

## 2023-07-31 MED ORDER — LISDEXAMFETAMINE DIMESYLATE 40 MG PO CAPS: 40.0000 mg | ORAL_CAPSULE | ORAL | 0 refills | Status: DC

## 2023-07-31 NOTE — Telephone Encounter (Signed)
 Pt called at 1:06p requesting refill of Vyvanse 40mg  to   [id number] [place], [place] - [address], [place] [address] Phone: [phone]  Fax: [phone]   Next appt 5/19

## 2023-07-31 NOTE — Telephone Encounter (Signed)
 Pended.

## 2023-09-11 ENCOUNTER — Telehealth (INDEPENDENT_AMBULATORY_CARE_PROVIDER_SITE_OTHER): Payer: 59 | Admitting: Adult Health

## 2023-09-11 ENCOUNTER — Encounter: Payer: Self-pay | Admitting: Adult Health

## 2023-09-11 DIAGNOSIS — F411 Generalized anxiety disorder: Secondary | ICD-10-CM

## 2023-09-11 DIAGNOSIS — F419 Anxiety disorder, unspecified: Secondary | ICD-10-CM | POA: Diagnosis not present

## 2023-09-11 DIAGNOSIS — F900 Attention-deficit hyperactivity disorder, predominantly inattentive type: Secondary | ICD-10-CM | POA: Diagnosis not present

## 2023-09-11 MED ORDER — LISDEXAMFETAMINE DIMESYLATE 40 MG PO CAPS: 40.0000 mg | ORAL_CAPSULE | ORAL | 0 refills | Status: DC

## 2023-09-11 NOTE — Progress Notes (Signed)
 Maria Yoder [id number] Apr 29, 1990 32 y.o.  Virtual Visit via Video Note  I connected with pt @ on 09/11/23 at 10:00 AM EDT by a video enabled telemedicine application and verified that I am speaking with the correct person using two identifiers.   I discussed the limitations of evaluation and management by telemedicine and the availability of in person appointments. The patient expressed understanding and agreed to proceed.  I discussed the assessment and treatment plan with the patient. The patient was provided an opportunity to ask questions and all were answered. The patient agreed with the plan and demonstrated an understanding of the instructions.   The patient was advised to call back or seek an in-person evaluation if the symptoms worsen or if the condition fails to improve as anticipated.  I provided 15 minutes of non-face-to-face time during this encounter.  The patient was located at home.  The provider was located at [facility].   Reagan Camera, NP   Subjective:   Patient ID:  Maria Yoder is a 33 y.o. (DOB March 23, 1991) female.  Chief Complaint: No chief complaint on file.   HPI Maria Yoder presents for follow-up of ADHD and GAD.  Describes mood today as "ok". Pleasant. Denies tearfulness. Mood symptoms - denies depression, anxiety and irritability. Reports stable interest and motivation. Denies panic attacks. Denies worry, rumination and over thinking. Reports mood is consistent. Stating "I feel like I'm doing good". Feels like Vyvanse  at 40mg  continues to work well for ADD symptoms. Reports taking Xanax  occasionally. Taking medications as prescribed.  Energy levels stable. Active, has a regular exercise routine.   Enjoys some usual interests and activities. Married. Lives with husband and 2 children - 8 and 4. Family local and supportive. Spending time with family. Appetite adequate. Weight stable - 120 pounds. Sleeps well  most nights. Averages 8 hours.  Focus and concentration stable. Completing tasks. Managing aspects of household. Stay at home mom.  Denies SI or HI.  Denies AH or VH. Denies self harm. Denies substance use.  Previous medication trials: Vyvanse   Review of Systems:  Review of Systems  Musculoskeletal:  Negative for gait problem.  Neurological:  Negative for tremors.  Psychiatric/Behavioral:         Please refer to HPI    Medications: I have reviewed the patient's current medications.  Current Outpatient Medications  Medication Sig Dispense Refill   ALPRAZolam  (XANAX ) 0.25 MG tablet Take 1 tablet (0.25 mg total) by mouth at bedtime as needed for anxiety. 30 tablet 2   ciprofloxacin  (CILOXAN ) 0.3 % ophthalmic solution Place 2 drops into both eyes every 2 (two) hours. Administer 1 drop, every 2 hours, while awake, for 2 days. Then 1 drop, every 4 hours, while awake, for the next 5 days. (Patient not taking: Reported on 04/10/2023) 10 mL 0   cyclobenzaprine  (FLEXERIL ) 10 MG tablet Take 1 tablet (10 mg total) by mouth 3 (three) times daily as needed for muscle spasms. 30 tablet 0   ibuprofen  (ADVIL ) 600 MG tablet Take 1 tablet (600 mg total) by mouth every 6 (six) hours as needed for moderate pain. (Patient not taking: Reported on 04/10/2023) 30 tablet 0   lisdexamfetamine (VYVANSE ) 40 MG capsule Take 1 capsule (40 mg total) by mouth every morning. 30 capsule 0   lisdexamfetamine (VYVANSE ) 40 MG capsule Take 1 capsule (40 mg total) by mouth every morning. 30 capsule 0   lisdexamfetamine (VYVANSE ) 40 MG capsule Take 1 capsule (40  mg total) by mouth every morning. 30 capsule 0   UNABLE TO FIND Med Name: POLYMYXIN eye drops (Patient not taking: Reported on 04/10/2023)     No current facility-administered medications for this visit.    Medication Side Effects: None  Allergies: No Known Allergies  Past Medical History:  Diagnosis Date   ADHD    Anxiety    Depression    PP    Family  History  Problem Relation Age of Onset   Thyroid disease Mother    Hypertension Father    Heart disease Paternal Aunt    Thyroid disease Maternal Grandmother    Hypertension Paternal Grandmother    Cancer Paternal Grandfather        stomach    Social History   Socioeconomic History   Marital status: Married    Spouse name: Not on file   Number of children: 2   Years of education: Not on file   Highest education level: Not on file  Occupational History   Not on file  Tobacco Use   Smoking status: Never   Smokeless tobacco: Never  Vaping Use   Vaping status: Never Used  Substance and Sexual Activity   Alcohol use: No   Drug use: No   Sexual activity: Yes  Other Topics Concern   Not on file  Social History Narrative   Not on file   Social Drivers of Health   Financial Resource Strain: Not on file  Food Insecurity: Not on file  Transportation Needs: Not on file  Physical Activity: Not on file  Stress: Not on file  Social Connections: Not on file  Intimate Partner Violence: Not on file    Past Medical History, Surgical history, Social history, and Family history were reviewed and updated as appropriate.   Please see review of systems for further details on the patient's review from today.   Objective:   Physical Exam:  There were no vitals taken for this visit.  Physical Exam Constitutional:      General: She is not in acute distress. Musculoskeletal:        General: No deformity.  Neurological:     Mental Status: She is alert and oriented to person, place, and time.     Coordination: Coordination normal.  Psychiatric:        Attention and Perception: Attention and perception normal. She does not perceive auditory or visual hallucinations.        Mood and Affect: Mood normal. Mood is not anxious or depressed. Affect is not labile, blunt, angry or inappropriate.        Speech: Speech normal.        Behavior: Behavior normal.        Thought Content:  Thought content normal. Thought content is not paranoid or delusional. Thought content does not include homicidal or suicidal ideation. Thought content does not include homicidal or suicidal plan.        Cognition and Memory: Cognition and memory normal.        Judgment: Judgment normal.     Comments: Insight intact     Lab Review:  No results found for: "NA", "K", "CL", "CO2", "GLUCOSE", "BUN", "CREATININE", "CALCIUM", "PROT", "ALBUMIN", "AST", "ALT", "ALKPHOS", "BILITOT", "GFRNONAA", "GFRAA"     Component Value Date/Time   WBC 8.9 12/20/2018 0600   RBC 3.34 (L) 12/20/2018 0600   HGB 10.5 (L) 12/20/2018 0600   HCT 31.2 (L) 12/20/2018 0600   PLT 144 (L) 12/20/2018 0600  MCV 93.4 12/20/2018 0600   MCH 31.4 12/20/2018 0600   MCHC 33.7 12/20/2018 0600   RDW 13.9 12/20/2018 0600    No results found for: "POCLITH", "LITHIUM"   No results found for: "PHENYTOIN", "PHENOBARB", "VALPROATE", "CBMZ"   .res Assessment: Plan:    Plan:  PDMP reviewed  1. Vyvanse  40mg  daily    2. Xanax  0.25mg  at hs prn - none needed this visit  Monitor BP between visits while taking stimulant medication.   RTC 6 months - will call in 3 months for next set of prescriptions.   15 minutes spent dedicated to the care of this patient on the date of this encounter to include pre-visit review of records, ordering of medication, post visit documentation, and face-to-face time with the patient discussing ADHD and GAD. Discussed continuing current medication regimen.  Patient advised to contact office with any questions, adverse effects, or acute worsening in signs and symptoms.  Discussed potential benefits, risks, and side effects of stimulants with patient to include increased heart rate, palpitations, insomnia, increased anxiety, increased irritability, or decreased appetite.  Instructed patient to contact office if experiencing any significant tolerability issues.   Discussed potential benefits, risk, and  side effects of benzodiazepines to include potential risk of tolerance and dependence, as well as possible drowsiness.  Advised patient not to drive if experiencing drowsiness and to take lowest possible effective dose to minimize risk of dependence and tolerance.   There are no diagnoses linked to this encounter.   Please see After Visit Summary for patient specific instructions.  Future Appointments  Date Time Provider Department Center  09/11/2023 10:00 AM Malaisha Silliman Nattalie, NP CP-[facility] None    No orders of the defined types were placed in this encounter.     -------------------------------

## 2023-09-17 IMAGING — DX DG FINGER INDEX 2+V*R*
3 series · 3 of 3 positions shown · non-contrast
Comparison: None Available.

CLINICAL DATA: Right index finger injury. Slammed in car door.

EXAM:
RIGHT INDEX FINGER 2+V

[finger pa (1 of 2)]
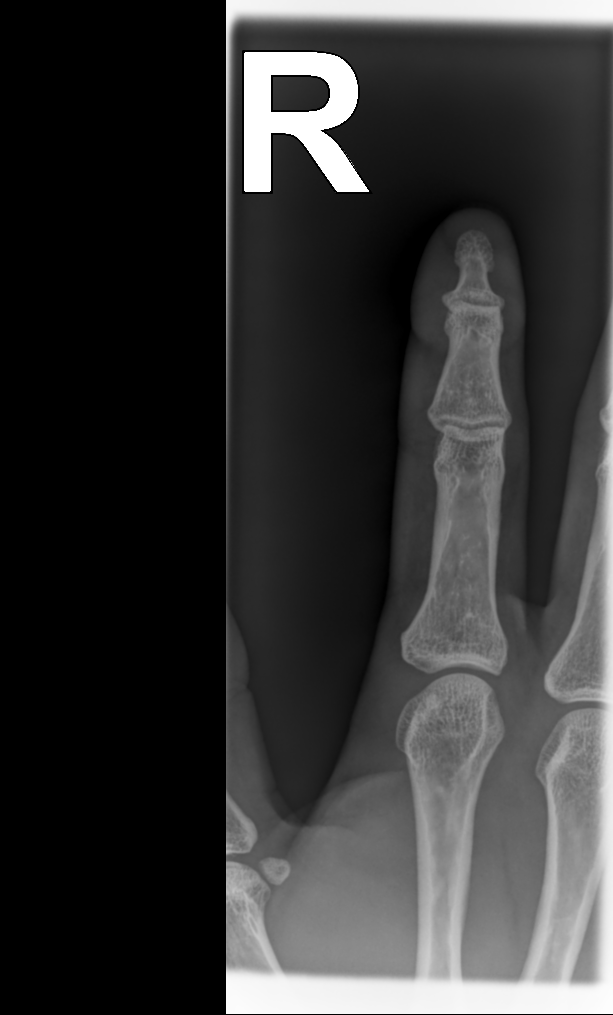

[finger lat]
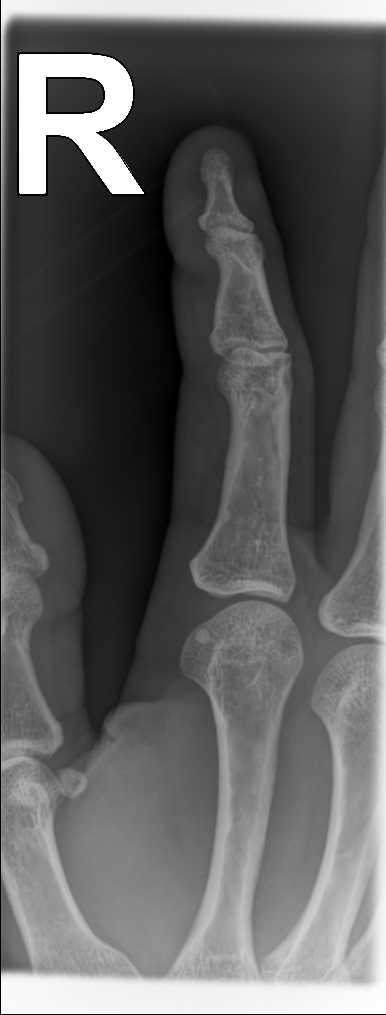

[finger pa (2 of 2)]
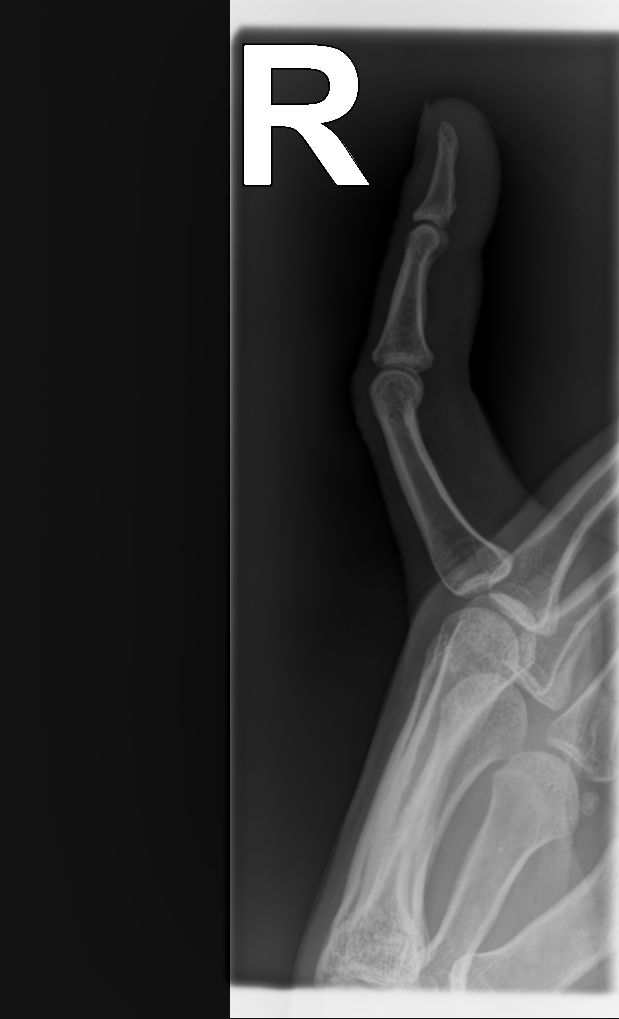

[3 of 3 positions shown; findings below may reference images not displayed]

FINDINGS: There is no evidence of fracture or dislocation. There is no
evidence of arthropathy or other focal bone abnormality. Soft
tissues are unremarkable.
IMPRESSION: No acute fracture or dislocation within the RIGHT index finger.

## 2023-12-29 ENCOUNTER — Other Ambulatory Visit: Payer: Self-pay | Admitting: Obstetrics and Gynecology

## 2023-12-29 DIAGNOSIS — N6311 Unspecified lump in the right breast, upper outer quadrant: Secondary | ICD-10-CM

## 2024-01-02 ENCOUNTER — Ambulatory Visit
Admission: RE | Admit: 2024-01-02 | Discharge: 2024-01-02 | Disposition: A | Discharge status: Home / Self Care | Payer: Self-pay | Source: Ambulatory Visit | Attending: Obstetrics and Gynecology | Admitting: Obstetrics and Gynecology

## 2024-01-02 DIAGNOSIS — N6311 Unspecified lump in the right breast, upper outer quadrant: Secondary | ICD-10-CM

## 2024-01-05 ENCOUNTER — Other Ambulatory Visit: Payer: Self-pay

## 2024-01-05 ENCOUNTER — Telehealth: Payer: Self-pay | Admitting: Adult Health

## 2024-01-05 DIAGNOSIS — F900 Attention-deficit hyperactivity disorder, predominantly inattentive type: Secondary | ICD-10-CM

## 2024-01-05 DIAGNOSIS — F411 Generalized anxiety disorder: Secondary | ICD-10-CM

## 2024-01-05 NOTE — Telephone Encounter (Signed)
 Pended

## 2024-01-05 NOTE — Telephone Encounter (Signed)
 Pt called asking for a refill on her vyvanse  40 mg. [facility]acy is [facility] [address]. Next appt 11/10

## 2024-01-08 MED ORDER — LISDEXAMFETAMINE DIMESYLATE 40 MG PO CAPS: 40.0000 mg | ORAL_CAPSULE | ORAL | 0 refills | Status: DC

## 2024-03-04 ENCOUNTER — Telehealth (INDEPENDENT_AMBULATORY_CARE_PROVIDER_SITE_OTHER): Admitting: Adult Health

## 2024-03-04 ENCOUNTER — Encounter: Payer: Self-pay | Admitting: Adult Health

## 2024-03-04 DIAGNOSIS — F909 Attention-deficit hyperactivity disorder, unspecified type: Secondary | ICD-10-CM

## 2024-03-04 DIAGNOSIS — F411 Generalized anxiety disorder: Secondary | ICD-10-CM | POA: Diagnosis not present

## 2024-03-04 DIAGNOSIS — F900 Attention-deficit hyperactivity disorder, predominantly inattentive type: Secondary | ICD-10-CM

## 2024-03-04 MED ORDER — LISDEXAMFETAMINE DIMESYLATE 40 MG PO CAPS: 40.0000 mg | ORAL_CAPSULE | ORAL | 0 refills | Status: DC

## 2024-03-04 NOTE — Progress Notes (Signed)
 Maria Yoder [id number] January 29, 1991 33 y.o.  Virtual Visit via Video Note  I connected with pt @ on 03/04/24 at 10:00 AM EST by a video enabled telemedicine application and verified that I am speaking with the correct person using two identifiers.   I discussed the limitations of evaluation and management by telemedicine and the availability of in person appointments. The patient expressed understanding and agreed to proceed.  I discussed the assessment and treatment plan with the patient. The patient was provided an opportunity to ask questions and all were answered. The patient agreed with the plan and demonstrated an understanding of the instructions.   The patient was advised to call back or seek an in-person evaluation if the symptoms worsen or if the condition fails to improve as anticipated.  I provided 10 minutes of non-face-to-face time during this encounter.  The patient was located at home.  The provider was located at [facility].   Angeline LOISE Sayers, NP   Subjective:   Patient ID:  Maria Yoder is a 33 y.o. (DOB 10/01/1990) female.  Chief Complaint: No chief complaint on file.   HPI Aline CHARM Nola Vannie presents for follow-up of ADHD and GAD.  Describes mood today as ok. Pleasant. Denies tearfulness. Mood symptoms - denies depression, anxiety and irritability. Reports stable interest and motivation. Denies panic attacks. Denies worry, rumination and over thinking. Reports mood is stable. Stating I feel like I'm doing ok. Feels like Vyvanse  at 40mg  continues to work well for ADD symptoms. Reports taking Xanax  occasionally. Taking medications as prescribed.  Energy levels stable. Active, has a regular exercise routine.   Enjoys some usual interests and activities. Married. Lives with husband and 2 children. Family local and supportive. Spending time with family. Appetite adequate. Weight stable - 120 pounds. Sleeps well most nights.  Averages 8 to 9 hours.  Focus and concentration stable. Completing tasks. Managing aspects of household. Stay at home mom.  Denies SI or HI.  Denies AH or VH. Denies self harm. Denies substance use.  Previous medication trials: Vyvanse    Review of Systems:  Review of Systems  Musculoskeletal:  Negative for gait problem.  Neurological:  Negative for tremors.  Psychiatric/Behavioral:         Please refer to HPI    Medications: I have reviewed the patient's current medications.  Current Outpatient Medications  Medication Sig Dispense Refill   ALPRAZolam  (XANAX ) 0.25 MG tablet Take 1 tablet (0.25 mg total) by mouth at bedtime as needed for anxiety. 30 tablet 2   lisdexamfetamine (VYVANSE ) 40 MG capsule Take 1 capsule (40 mg total) by mouth every morning. 30 capsule 0   lisdexamfetamine (VYVANSE ) 40 MG capsule Take 1 capsule (40 mg total) by mouth every morning. 30 capsule 0   lisdexamfetamine (VYVANSE ) 40 MG capsule Take 1 capsule (40 mg total) by mouth every morning. 30 capsule 0   No current facility-administered medications for this visit.    Medication Side Effects: None  Allergies: No Known Allergies  Past Medical History:  Diagnosis Date   ADHD    Anxiety    Depression    PP    Family History  Problem Relation Age of Onset   Thyroid disease Mother    Hypertension Father    Heart disease Paternal Aunt    Thyroid disease Maternal Grandmother    Hypertension Paternal Grandmother    Cancer Paternal Grandfather        stomach    Social History  Socioeconomic History   Marital status: Married    Spouse name: Not on file   Number of children: 2   Years of education: Not on file   Highest education level: Not on file  Occupational History   Not on file  Tobacco Use   Smoking status: Never   Smokeless tobacco: Never  Vaping Use   Vaping status: Never Used  Substance and Sexual Activity   Alcohol use: No   Drug use: No   Sexual activity: Yes  Other  Topics Concern   Not on file  Social History Narrative   Not on file   Social Drivers of Health   Financial Resource Strain: Not on file  Food Insecurity: Not on file  Transportation Needs: Not on file  Physical Activity: Not on file  Stress: Not on file  Social Connections: Not on file  Intimate Partner Violence: Not on file    Past Medical History, Surgical history, Social history, and Family history were reviewed and updated as appropriate.   Please see review of systems for further details on the patient's review from today.   Objective:   Physical Exam:  There were no vitals taken for this visit.  Physical Exam Constitutional:      General: She is not in acute distress. Musculoskeletal:        General: No deformity.  Neurological:     Mental Status: She is alert and oriented to person, place, and time.     Coordination: Coordination normal.  Psychiatric:        Attention and Perception: Attention and perception normal. She does not perceive auditory or visual hallucinations.        Mood and Affect: Mood normal. Mood is not anxious or depressed. Affect is not labile, blunt, angry or inappropriate.        Speech: Speech normal.        Behavior: Behavior normal.        Thought Content: Thought content normal. Thought content is not paranoid or delusional. Thought content does not include homicidal or suicidal ideation. Thought content does not include homicidal or suicidal plan.        Cognition and Memory: Cognition and memory normal.        Judgment: Judgment normal.     Comments: Insight intact     Lab Review:  No results found for: NA, K, CL, CO2, GLUCOSE, BUN, CREATININE, CALCIUM, PROT, ALBUMIN, AST, ALT, ALKPHOS, BILITOT, GFRNONAA, GFRAA     Component Value Date/Time   WBC 8.9 12/20/2018 0600   RBC 3.34 (L) 12/20/2018 0600   HGB 10.5 (L) 12/20/2018 0600   HCT 31.2 (L) 12/20/2018 0600   PLT 144 (L) 12/20/2018 0600   MCV  93.4 12/20/2018 0600   MCH 31.4 12/20/2018 0600   MCHC 33.7 12/20/2018 0600   RDW 13.9 12/20/2018 0600    No results found for: POCLITH, LITHIUM   No results found for: PHENYTOIN, PHENOBARB, VALPROATE, CBMZ   .res Assessment: Plan:    Plan:  PDMP reviewed  1. Vyvanse  40mg  daily    2. Xanax  0.25mg  at hs prn - none needed this visit  Monitor BP between visits while taking stimulant medication.   RTC 6 months - will call in 3 months for next set of prescriptions.   15 minutes spent dedicated to the care of this patient on the date of this encounter to include pre-visit review of records, ordering of medication, post visit documentation, and face-to-face time with the  patient discussing ADHD and GAD. Discussed continuing current medication regimen.  Patient advised to contact office with any questions, adverse effects, or acute worsening in signs and symptoms.  Discussed potential benefits, risks, and side effects of stimulants with patient to include increased heart rate, palpitations, insomnia, increased anxiety, increased irritability, or decreased appetite.  Instructed patient to contact office if experiencing any significant tolerability issues.   Discussed potential benefits, risk, and side effects of benzodiazepines to include potential risk of tolerance and dependence, as well as possible drowsiness.  Advised patient not to drive if experiencing drowsiness and to take lowest possible effective dose to minimize risk of dependence and tolerance.    There are no diagnoses linked to this encounter.   Please see After Visit Summary for patient specific instructions.  Future Appointments  Date Time Provider Department Center  03/04/2024 10:00 AM Joby Hershkowitz Nattalie, NP [facility] None    No orders of the defined types were placed in this encounter.     -------------------------------

## 2024-04-29 ENCOUNTER — Telehealth: Payer: Self-pay | Admitting: Adult Health

## 2024-04-29 DIAGNOSIS — F411 Generalized anxiety disorder: Secondary | ICD-10-CM

## 2024-04-29 DIAGNOSIS — F900 Attention-deficit hyperactivity disorder, predominantly inattentive type: Secondary | ICD-10-CM

## 2024-04-29 MED ORDER — ALPRAZOLAM 0.25 MG PO TABS: 0.2500 mg | ORAL_TABLET | Freq: Every evening | ORAL | 0 refills | Status: AC | PRN

## 2024-04-29 MED ORDER — LISDEXAMFETAMINE DIMESYLATE 30 MG PO CAPS: 30.0000 mg | ORAL_CAPSULE | Freq: Every day | ORAL | 0 refills | Status: DC

## 2024-04-29 NOTE — Telephone Encounter (Signed)
 Pt reporting Vyvanse  40 mg is too intense and wants to decrease to 30 mg, also asking for RF on alprazolam . Pended Rx for Vyvanse  30 mg and alprazolam .

## 2024-04-29 NOTE — Telephone Encounter (Signed)
 Pt called at 1:04p requesting refill of Vyvanse  40 be decreased to 30mg .  She has not picked up the refill that is effective today.  She also would like refill of Xanax .  Send both scripts to  [phone] [address], [place] - [address], [place] [address] Phone: [phone]  Fax: [phone]   Next appt 5/11

## 2024-05-29 ENCOUNTER — Other Ambulatory Visit: Payer: Self-pay

## 2024-05-29 ENCOUNTER — Telehealth: Payer: Self-pay | Admitting: Adult Health

## 2024-05-29 DIAGNOSIS — F900 Attention-deficit hyperactivity disorder, predominantly inattentive type: Secondary | ICD-10-CM

## 2024-05-29 MED ORDER — LISDEXAMFETAMINE DIMESYLATE 30 MG PO CAPS: 30.0000 mg | ORAL_CAPSULE | Freq: Every day | ORAL | 0 refills | Status: AC

## 2024-05-29 NOTE — Telephone Encounter (Signed)
 Next appt is 09/02/24. Requesting a refill for Vyvanse  30 mg from  [id number] - [place], [place] - [address]   Phone: [phone]  Fax: [phone]

## 2024-05-29 NOTE — Telephone Encounter (Signed)
 Pended 3 RF for lisdexamfetamine  30 mg

## 2024-09-02 ENCOUNTER — Telehealth: Admitting: Adult Health
